# Patient Record
Sex: Male | Born: 1965 | Race: Black or African American | Hispanic: No | Marital: Single | Smoking: Former smoker
Health system: Southern US, Community
[De-identification: ages and names within clinical notes are randomized; demographics above are authoritative.]

## PROBLEM LIST (undated history)

## (undated) DIAGNOSIS — N200 Calculus of kidney: Secondary | ICD-10-CM

## (undated) DIAGNOSIS — I10 Essential (primary) hypertension: Secondary | ICD-10-CM

## (undated) HISTORY — PX: BACK SURGERY: SHX140

---

## 1998-06-03 ENCOUNTER — Encounter: Payer: Self-pay | Admitting: Emergency Medicine

## 1998-06-03 ENCOUNTER — Emergency Department (HOSPITAL_COMMUNITY): Admission: EM | Admit: 1998-06-03 | Discharge: 1998-06-03 | Payer: Self-pay | Admitting: Emergency Medicine

## 1999-09-26 ENCOUNTER — Emergency Department (HOSPITAL_COMMUNITY): Admission: EM | Admit: 1999-09-26 | Discharge: 1999-09-26 | Payer: Self-pay | Admitting: Emergency Medicine

## 2004-01-11 ENCOUNTER — Emergency Department (HOSPITAL_COMMUNITY): Admission: EM | Admit: 2004-01-11 | Discharge: 2004-01-11 | Payer: Self-pay | Admitting: Emergency Medicine

## 2005-04-25 ENCOUNTER — Emergency Department: Payer: Self-pay | Admitting: General Practice

## 2005-05-29 ENCOUNTER — Emergency Department: Payer: Self-pay | Admitting: Emergency Medicine

## 2005-06-06 ENCOUNTER — Ambulatory Visit: Payer: Self-pay | Admitting: Physician Assistant

## 2005-10-06 ENCOUNTER — Emergency Department: Payer: Self-pay | Admitting: Emergency Medicine

## 2008-08-12 ENCOUNTER — Emergency Department (HOSPITAL_COMMUNITY): Admission: EM | Admit: 2008-08-12 | Discharge: 2008-08-12 | Payer: Self-pay | Admitting: Emergency Medicine

## 2009-11-21 ENCOUNTER — Emergency Department (HOSPITAL_COMMUNITY): Admission: EM | Admit: 2009-11-21 | Discharge: 2009-11-21 | Payer: Self-pay | Admitting: Emergency Medicine

## 2010-01-31 ENCOUNTER — Emergency Department (HOSPITAL_COMMUNITY): Admission: EM | Admit: 2010-01-31 | Discharge: 2010-01-31 | Payer: Self-pay | Admitting: Emergency Medicine

## 2010-02-01 ENCOUNTER — Emergency Department (HOSPITAL_COMMUNITY)
Admission: EM | Admit: 2010-02-01 | Discharge: 2010-02-01 | Payer: Self-pay | Source: Home / Self Care | Admitting: Emergency Medicine

## 2010-02-18 ENCOUNTER — Emergency Department (HOSPITAL_COMMUNITY): Admission: EM | Admit: 2010-02-18 | Discharge: 2010-02-18 | Payer: Self-pay | Admitting: Emergency Medicine

## 2010-04-14 ENCOUNTER — Emergency Department (HOSPITAL_COMMUNITY)
Admission: EM | Admit: 2010-04-14 | Discharge: 2010-04-14 | Payer: Self-pay | Source: Home / Self Care | Admitting: Emergency Medicine

## 2010-04-15 LAB — URINALYSIS, ROUTINE W REFLEX MICROSCOPIC
Bilirubin Urine: NEGATIVE
Hgb urine dipstick: NEGATIVE
Ketones, ur: NEGATIVE mg/dL
Nitrite: NEGATIVE
Protein, ur: NEGATIVE mg/dL
Specific Gravity, Urine: 1.018 (ref 1.005–1.030)
Urine Glucose, Fasting: NEGATIVE mg/dL
Urobilinogen, UA: 1 mg/dL (ref 0.0–1.0)
pH: 6.5 (ref 5.0–8.0)

## 2010-04-15 LAB — POCT I-STAT, CHEM 8
BUN: 13 mg/dL (ref 6–23)
Calcium, Ion: 1.2 mmol/L (ref 1.12–1.32)
Chloride: 105 mEq/L (ref 96–112)
Creatinine, Ser: 1.2 mg/dL (ref 0.4–1.5)
Glucose, Bld: 95 mg/dL (ref 70–99)
HCT: 46 % (ref 39.0–52.0)
Hemoglobin: 15.6 g/dL (ref 13.0–17.0)
Potassium: 3.9 mEq/L (ref 3.5–5.1)
Sodium: 144 mEq/L (ref 135–145)
TCO2: 30 mmol/L (ref 0–100)

## 2010-06-11 LAB — URINALYSIS, ROUTINE W REFLEX MICROSCOPIC
Bilirubin Urine: NEGATIVE
Bilirubin Urine: NEGATIVE
Glucose, UA: NEGATIVE mg/dL
Glucose, UA: NEGATIVE mg/dL
Hgb urine dipstick: NEGATIVE
Hgb urine dipstick: NEGATIVE
Ketones, ur: NEGATIVE mg/dL
Ketones, ur: NEGATIVE mg/dL
Nitrite: NEGATIVE
Nitrite: NEGATIVE
Protein, ur: NEGATIVE mg/dL
Protein, ur: NEGATIVE mg/dL
Specific Gravity, Urine: 1.022 (ref 1.005–1.030)
Specific Gravity, Urine: 1.022 (ref 1.005–1.030)
Urobilinogen, UA: 0.2 mg/dL (ref 0.0–1.0)
Urobilinogen, UA: 0.2 mg/dL (ref 0.0–1.0)
pH: 6 (ref 5.0–8.0)
pH: 6.5 (ref 5.0–8.0)

## 2010-06-11 LAB — BASIC METABOLIC PANEL
BUN: 9 mg/dL (ref 6–23)
CO2: 26 mEq/L (ref 19–32)
Calcium: 9.6 mg/dL (ref 8.4–10.5)
Chloride: 106 mEq/L (ref 96–112)
Creatinine, Ser: 0.99 mg/dL (ref 0.4–1.5)
GFR calc Af Amer: >60 mL/min (ref >60)
GFR calc non Af Amer: >60 mL/min (ref >60)
Glucose, Bld: 108 mg/dL — ABNORMAL HIGH (ref 70–99)
Potassium: 3.7 mEq/L (ref 3.5–5.1)
Sodium: 141 mEq/L (ref 135–145)

## 2010-06-11 LAB — CBC
HCT: 42.2 % (ref 39.0–52.0)
HCT: 44.5 % (ref 39.0–52.0)
Hemoglobin: 14.4 g/dL (ref 13.0–17.0)
Hemoglobin: 14.8 g/dL (ref 13.0–17.0)
MCH: 29.5 pg (ref 26.0–34.0)
MCH: 30 pg (ref 26.0–34.0)
MCHC: 33.3 g/dL (ref 30.0–36.0)
MCHC: 34 g/dL (ref 30.0–36.0)
MCV: 88 fL (ref 78.0–100.0)
MCV: 88.5 fL (ref 78.0–100.0)
Platelets: 209 10*3/uL (ref 150–400)
Platelets: 221 10*3/uL (ref 150–400)
RBC: 4.79 MIL/uL (ref 4.22–5.81)
RBC: 5.03 MIL/uL (ref 4.22–5.81)
RDW: 14.5 % (ref 11.5–15.5)
RDW: 14.6 % (ref 11.5–15.5)
WBC: 7.1 10*3/uL (ref 4.0–10.5)
WBC: 8.6 10*3/uL (ref 4.0–10.5)

## 2010-06-11 LAB — HEPATIC FUNCTION PANEL
ALT: 14 U/L (ref 0–53)
AST: 21 U/L (ref 0–37)
Albumin: 3.7 g/dL (ref 3.5–5.2)
Alkaline Phosphatase: 42 U/L (ref 39–117)
Bilirubin, Direct: <0.1 mg/dL (ref 0.0–0.3)
Total Bilirubin: 0.4 mg/dL (ref 0.3–1.2)
Total Protein: 6.7 g/dL (ref 6.0–8.3)

## 2010-06-11 LAB — DIFFERENTIAL
Basophils Absolute: 0 10*3/uL (ref 0.0–0.1)
Basophils Absolute: 0 10*3/uL (ref 0.0–0.1)
Basophils Relative: 0 % (ref 0–1)
Basophils Relative: 0 % (ref 0–1)
Eosinophils Absolute: 0 10*3/uL (ref 0.0–0.7)
Eosinophils Absolute: 0.1 10*3/uL (ref 0.0–0.7)
Eosinophils Relative: 1 % (ref 0–5)
Eosinophils Relative: 1 % (ref 0–5)
Lymphocytes Relative: 10 % — ABNORMAL LOW (ref 12–46)
Lymphocytes Relative: 9 % — ABNORMAL LOW (ref 12–46)
Lymphs Abs: 0.6 10*3/uL — ABNORMAL LOW (ref 0.7–4.0)
Lymphs Abs: 0.9 10*3/uL (ref 0.7–4.0)
Monocytes Absolute: 0.5 10*3/uL (ref 0.1–1.0)
Monocytes Absolute: 0.5 10*3/uL (ref 0.1–1.0)
Monocytes Relative: 6 % (ref 3–12)
Monocytes Relative: 7 % (ref 3–12)
Neutro Abs: 5.9 10*3/uL (ref 1.7–7.7)
Neutro Abs: 7.1 10*3/uL (ref 1.7–7.7)
Neutrophils Relative %: 83 % — ABNORMAL HIGH (ref 43–77)
Neutrophils Relative %: 84 % — ABNORMAL HIGH (ref 43–77)

## 2010-06-11 LAB — POCT I-STAT, CHEM 8
BUN: 12 mg/dL (ref 6–23)
Calcium, Ion: 1.18 mmol/L (ref 1.12–1.32)
Chloride: 104 mEq/L (ref 96–112)
Creatinine, Ser: 1.2 mg/dL (ref 0.4–1.5)
Glucose, Bld: 97 mg/dL (ref 70–99)
HCT: 46 % (ref 39.0–52.0)
Hemoglobin: 15.6 g/dL (ref 13.0–17.0)
Potassium: 3.5 mEq/L (ref 3.5–5.1)
Sodium: 143 mEq/L (ref 135–145)
TCO2: 28 mmol/L (ref 0–100)

## 2010-06-11 LAB — LIPASE, BLOOD: Lipase: 36 U/L (ref 11–59)

## 2010-11-21 ENCOUNTER — Emergency Department (HOSPITAL_COMMUNITY)
Admission: EM | Admit: 2010-11-21 | Discharge: 2010-11-21 | Disposition: A | Discharge status: Home / Self Care | Payer: Self-pay | Attending: Emergency Medicine | Admitting: Emergency Medicine

## 2010-11-21 DIAGNOSIS — L03019 Cellulitis of unspecified finger: Secondary | ICD-10-CM | POA: Insufficient documentation

## 2010-11-21 DIAGNOSIS — M7989 Other specified soft tissue disorders: Secondary | ICD-10-CM | POA: Insufficient documentation

## 2010-11-21 DIAGNOSIS — I10 Essential (primary) hypertension: Secondary | ICD-10-CM | POA: Insufficient documentation

## 2010-11-21 DIAGNOSIS — K219 Gastro-esophageal reflux disease without esophagitis: Secondary | ICD-10-CM | POA: Insufficient documentation

## 2010-11-21 DIAGNOSIS — M79609 Pain in unspecified limb: Secondary | ICD-10-CM | POA: Insufficient documentation

## 2013-01-10 ENCOUNTER — Encounter (HOSPITAL_COMMUNITY): Payer: Self-pay | Admitting: Emergency Medicine

## 2013-01-10 ENCOUNTER — Emergency Department (HOSPITAL_COMMUNITY)
Admission: EM | Admit: 2013-01-10 | Discharge: 2013-01-10 | Disposition: A | Discharge status: Home / Self Care | Payer: Non-veteran care | Attending: Emergency Medicine | Admitting: Emergency Medicine

## 2013-01-10 DIAGNOSIS — M5416 Radiculopathy, lumbar region: Secondary | ICD-10-CM

## 2013-01-10 DIAGNOSIS — Z87891 Personal history of nicotine dependence: Secondary | ICD-10-CM | POA: Insufficient documentation

## 2013-01-10 DIAGNOSIS — I10 Essential (primary) hypertension: Secondary | ICD-10-CM | POA: Insufficient documentation

## 2013-01-10 DIAGNOSIS — G8929 Other chronic pain: Secondary | ICD-10-CM | POA: Insufficient documentation

## 2013-01-10 DIAGNOSIS — Z79899 Other long term (current) drug therapy: Secondary | ICD-10-CM | POA: Insufficient documentation

## 2013-01-10 DIAGNOSIS — Z87442 Personal history of urinary calculi: Secondary | ICD-10-CM | POA: Insufficient documentation

## 2013-01-10 DIAGNOSIS — IMO0002 Reserved for concepts with insufficient information to code with codable children: Secondary | ICD-10-CM | POA: Insufficient documentation

## 2013-01-10 HISTORY — DX: Essential (primary) hypertension: I10

## 2013-01-10 HISTORY — DX: Calculus of kidney: N20.0

## 2013-01-10 MED ORDER — DEXAMETHASONE SODIUM PHOSPHATE 10 MG/ML IJ SOLN
10.0000 mg | Freq: Once | INTRAMUSCULAR | Status: AC
  Administered 2013-01-10: 10 mg via INTRAMUSCULAR
  Filled 2013-01-10: qty 1

## 2013-01-10 MED ORDER — CYCLOBENZAPRINE HCL 10 MG PO TABS: 10.0000 mg | ORAL_TABLET | Freq: Three times a day (TID) | ORAL | Status: DC | PRN

## 2013-01-10 MED ORDER — PREDNISONE 50 MG PO TABS: 50.0000 mg | ORAL_TABLET | Freq: Every day | ORAL | Status: DC

## 2013-01-10 MED ORDER — HYDROCODONE-ACETAMINOPHEN 5-325 MG PO TABS: 1.0000 | ORAL_TABLET | Freq: Four times a day (QID) | ORAL | Status: DC | PRN

## 2013-01-10 NOTE — ED Provider Notes (Signed)
CSN: [id number]     Arrival date & time 01/10/13  1156 History   First MD Initiated Contact with Patient 01/10/13 1212     Chief Complaint  Patient presents with  . Back Pain   (Consider location/radiation/quality/duration/timing/severity/associated sxs/prior Treatment) HPI Patient presents to the emergency department with chronic right lower back pain.  Patient, states he said, back troubles over the last 20 years, with surgeries and 2004 2008.  Patient, states, that since his last surgeries had previous persistent pain.  Patient, states, that he usually gets cortisone injections helped with his discomfort.  Patient denies nausea, vomiting, abdominal pain, numbness, weakness, dizziness, fever, or syncope.  The patient, states, that he does not normally take everyday medicines for his back pain other than over-the-counter medications Past Medical History  Diagnosis Date  . Hypertension   . Kidney stone    Past Surgical History  Procedure Laterality Date  . Back surgery     No family history on file. History  Substance Use Topics  . Smoking status: Former Smoker -- 0.50 packs/day    Types: Cigarettes    Quit date: 03/31/2002  . Smokeless tobacco: Never Used  . Alcohol Use: Yes     Comment: Socially     Review of Systems All other systems negative except as documented in the HPI. All pertinent positives and negatives as reviewed in the HPI. Allergies  Review of patient's allergies indicates not on file.  Home Medications   Current Outpatient Rx  Name  Route  Sig  Dispense  Refill  . acetaminophen (TYLENOL) 500 MG tablet   Oral   Take 500 mg by mouth every 6 (six) hours as needed for pain.         Marland Kitchen ibuprofen (ADVIL,MOTRIN) 200 MG tablet   Oral   Take 200 mg by mouth 2 (two) times daily as needed for pain.         Marland Kitchen METOPROLOL SUCCINATE ER PO   Oral   Take by mouth.         Marland Kitchen omeprazole (PRILOSEC) 20 MG capsule   Oral   Take 20 mg by mouth daily.           BP 132/87  Pulse 57  Temp(Src) 98.3 F (36.8 C) (Oral)  Resp 18  SpO2 99% Physical Exam  Nursing note and vitals reviewed. Constitutional: He is oriented to person, place, and time. He appears well-developed and well-nourished. No distress.  HENT:  Head: Normocephalic.  Eyes: Pupils are equal, round, and reactive to light.  Neck: Normal range of motion. Neck supple.  Pulmonary/Chest: Effort normal.  Musculoskeletal:       Back:  Neurological: He is alert and oriented to person, place, and time. He exhibits normal muscle tone. Coordination normal.  Skin: Skin is warm and dry.    ED Course  Procedures (including critical care time) Labs Review Labs Reviewed - No data to display Imaging Review No results found.  EKG Interpretation   None      patient retreated for sciatic-type, back pain.  Patient is advised followup with his primary care Dr. told to return here as needed.  Patient does not have any neurological deficits and normal reflexes bilaterally.  Patient also has a normal gait  MDM      Carlyle Dolly, PA-C 01/10/13 1302

## 2013-01-10 NOTE — ED Notes (Addendum)
Pt reports having chronic back pain that began over 20 years ago. Pt reports having back surgery in 2004 and 2008. Pt reports the pain worsened two weeks ago and he has difficulty falling asleep. Pt denies any recent re-injury or trauma. Pt is A/O x4, ambulatory without complications, vital signs WDL.

## 2013-01-10 NOTE — ED Provider Notes (Signed)
  Medical screening examination/treatment/procedure(s) were performed by non-physician practitioner and as supervising physician I was immediately available for consultation/collaboration.    Keyosha Tiedt, MD 01/10/13 1545 

## 2013-05-17 ENCOUNTER — Ambulatory Visit: Payer: Non-veteran care

## 2013-06-16 ENCOUNTER — Emergency Department (HOSPITAL_COMMUNITY): Payer: Non-veteran care

## 2013-06-16 ENCOUNTER — Emergency Department (HOSPITAL_COMMUNITY)
Admission: EM | Admit: 2013-06-16 | Discharge: 2013-06-16 | Disposition: A | Discharge status: Home / Self Care | Payer: Non-veteran care | Attending: Emergency Medicine | Admitting: Emergency Medicine

## 2013-06-16 ENCOUNTER — Encounter (HOSPITAL_COMMUNITY): Payer: Self-pay | Admitting: Emergency Medicine

## 2013-06-16 DIAGNOSIS — Z79899 Other long term (current) drug therapy: Secondary | ICD-10-CM | POA: Insufficient documentation

## 2013-06-16 DIAGNOSIS — Z9889 Other specified postprocedural states: Secondary | ICD-10-CM | POA: Insufficient documentation

## 2013-06-16 DIAGNOSIS — S59909A Unspecified injury of unspecified elbow, initial encounter: Secondary | ICD-10-CM | POA: Insufficient documentation

## 2013-06-16 DIAGNOSIS — I10 Essential (primary) hypertension: Secondary | ICD-10-CM | POA: Insufficient documentation

## 2013-06-16 DIAGNOSIS — Z791 Long term (current) use of non-steroidal anti-inflammatories (NSAID): Secondary | ICD-10-CM | POA: Insufficient documentation

## 2013-06-16 DIAGNOSIS — S99929A Unspecified injury of unspecified foot, initial encounter: Secondary | ICD-10-CM

## 2013-06-16 DIAGNOSIS — S4980XA Other specified injuries of shoulder and upper arm, unspecified arm, initial encounter: Secondary | ICD-10-CM | POA: Insufficient documentation

## 2013-06-16 DIAGNOSIS — M549 Dorsalgia, unspecified: Secondary | ICD-10-CM

## 2013-06-16 DIAGNOSIS — S6990XA Unspecified injury of unspecified wrist, hand and finger(s), initial encounter: Secondary | ICD-10-CM | POA: Insufficient documentation

## 2013-06-16 DIAGNOSIS — S8990XA Unspecified injury of unspecified lower leg, initial encounter: Secondary | ICD-10-CM | POA: Insufficient documentation

## 2013-06-16 DIAGNOSIS — Z87891 Personal history of nicotine dependence: Secondary | ICD-10-CM | POA: Insufficient documentation

## 2013-06-16 DIAGNOSIS — S46909A Unspecified injury of unspecified muscle, fascia and tendon at shoulder and upper arm level, unspecified arm, initial encounter: Secondary | ICD-10-CM | POA: Insufficient documentation

## 2013-06-16 DIAGNOSIS — IMO0002 Reserved for concepts with insufficient information to code with codable children: Secondary | ICD-10-CM | POA: Insufficient documentation

## 2013-06-16 DIAGNOSIS — S199XXA Unspecified injury of neck, initial encounter: Secondary | ICD-10-CM

## 2013-06-16 DIAGNOSIS — Z87442 Personal history of urinary calculi: Secondary | ICD-10-CM | POA: Insufficient documentation

## 2013-06-16 DIAGNOSIS — S0993XA Unspecified injury of face, initial encounter: Secondary | ICD-10-CM | POA: Insufficient documentation

## 2013-06-16 DIAGNOSIS — S99919A Unspecified injury of unspecified ankle, initial encounter: Secondary | ICD-10-CM

## 2013-06-16 DIAGNOSIS — S59919A Unspecified injury of unspecified forearm, initial encounter: Secondary | ICD-10-CM

## 2013-06-16 DIAGNOSIS — M7918 Myalgia, other site: Secondary | ICD-10-CM

## 2013-06-16 MED ORDER — DEXAMETHASONE SODIUM PHOSPHATE 10 MG/ML IJ SOLN
10.0000 mg | Freq: Once | INTRAMUSCULAR | Status: AC
  Administered 2013-06-16: 10 mg via INTRAVENOUS
  Filled 2013-06-16: qty 1

## 2013-06-16 MED ORDER — HYDROMORPHONE HCL PF 1 MG/ML IJ SOLN
1.0000 mg | Freq: Once | INTRAMUSCULAR | Status: AC
Start: 2013-06-16 — End: 2013-06-16
  Administered 2013-06-16: 1 mg via INTRAVENOUS
  Filled 2013-06-16: qty 1

## 2013-06-16 MED ORDER — SODIUM CHLORIDE 0.9 % IV SOLN
INTRAVENOUS | Status: DC
  Administered 2013-06-16: 10:00:00 via INTRAVENOUS

## 2013-06-16 MED ORDER — HYDROMORPHONE HCL PF 1 MG/ML IJ SOLN
0.5000 mg | Freq: Once | INTRAMUSCULAR | Status: AC
Start: 2013-06-16 — End: 2013-06-16
  Administered 2013-06-16: 0.5 mg via INTRAVENOUS
  Filled 2013-06-16: qty 1

## 2013-06-16 MED ORDER — OXYCODONE-ACETAMINOPHEN 5-325 MG PO TABS: 1.0000 | ORAL_TABLET | Freq: Four times a day (QID) | ORAL | Status: DC | PRN

## 2013-06-16 MED ORDER — HYDROMORPHONE HCL PF 1 MG/ML IJ SOLN
1.0000 mg | Freq: Once | INTRAMUSCULAR | Status: AC
  Administered 2013-06-16: 1 mg via INTRAVENOUS
  Filled 2013-06-16: qty 1

## 2013-06-16 MED ORDER — IBUPROFEN 800 MG PO TABS: 800.0000 mg | ORAL_TABLET | Freq: Three times a day (TID) | ORAL | Status: DC

## 2013-06-16 MED ORDER — CYCLOBENZAPRINE HCL 10 MG PO TABS: 10.0000 mg | ORAL_TABLET | Freq: Two times a day (BID) | ORAL | Status: DC | PRN

## 2013-06-16 NOTE — ED Notes (Signed)
Bed: WA23 Expected date:  Expected time:  Means of arrival:  Comments: EMS-back pain 

## 2013-06-16 NOTE — ED Provider Notes (Signed)
Medical screening examination/treatment/procedure(s) were performed by non-physician practitioner and as supervising physician I was immediately available for consultation/collaboration.   EKG Interpretation None        Rigby Leonhardt T Margaree Sandhu, MD 06/16/13 1820 
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
## 2013-06-16 NOTE — Discharge Instructions (Signed)
Back Pain, Adult Low back pain is very common. About 1 in 5 people have back pain.The cause of low back pain is rarely dangerous. The pain often gets better over time.About half of people with a sudden onset of back pain feel better in just 2 weeks. About 8 in 10 people feel better by 6 weeks.  CAUSES Some common causes of back pain include:  Strain of the muscles or ligaments supporting the spine.  Wear and tear (degeneration) of the spinal discs.  Arthritis.  Direct injury to the back. DIAGNOSIS Most of the time, the direct cause of low back pain is not known.However, back pain can be treated effectively even when the exact cause of the pain is unknown.Answering your caregiver's questions about your overall health and symptoms is one of the most accurate ways to make sure the cause of your pain is not dangerous. If your caregiver needs more information, he or she may order lab work or imaging tests (X-rays or MRIs).However, even if imaging tests show changes in your back, this usually does not require surgery. HOME CARE INSTRUCTIONS For many people, back pain returns.Since low back pain is rarely dangerous, it is often a condition that people can learn to [facility] their own.   Remain active. It is stressful on the back to sit or stand in one place. Do not sit, drive, or stand in one place for more than 30 minutes at a time. Take short walks on level surfaces as soon as pain allows.Try to increase the length of time you walk each day.  Do not stay in bed.Resting more than 1 or 2 days can delay your recovery.  Do not avoid exercise or work.Your body is made to move.It is not dangerous to be active, even though your back may hurt.Your back will likely heal faster if you return to being active before your pain is gone.  Pay attention to your body when you bend and lift. Many people have less discomfortwhen lifting if they bend their knees, keep the load close to their bodies,and  avoid twisting. Often, the most comfortable positions are those that put less stress on your recovering back.  Find a comfortable position to sleep. Use a firm mattress and lie on your side with your knees slightly bent. If you lie on your back, put a pillow under your knees.  Only take over-the-counter or prescription medicines as directed by your caregiver. Over-the-counter medicines to reduce pain and inflammation are often the most helpful.Your caregiver may prescribe muscle relaxant drugs.These medicines help dull your pain so you can more quickly return to your normal activities and healthy exercise.  Put ice on the injured area.  Put ice in a plastic bag.  Place a towel between your skin and the bag.  Leave the ice on for 15-20 minutes, 03-04 times a day for the first 2 to 3 days. After that, ice and heat may be alternated to reduce pain and spasms.  Ask your caregiver about trying back exercises and gentle massage. This may be of some benefit.  Avoid feeling anxious or stressed.Stress increases muscle tension and can worsen back pain.It is important to recognize when you are anxious or stressed and learn ways to manage it.Exercise is a great option. SEEK MEDICAL CARE IF:  You have pain that is not relieved with rest or medicine.  You have pain that does not improve in 1 week.  You have new symptoms.  You are generally not feeling well. SEEK  IMMEDIATE MEDICAL CARE IF:   You have pain that radiates from your back into your legs.  You develop new bowel or bladder control problems.  You have unusual weakness or numbness in your arms or legs.  You develop nausea or vomiting.  You develop abdominal pain.  You feel faint. Document Released: 03/17/2005 Document Revised: 09/16/2011 Document Reviewed: 08/05/2010 [facility] Patient Information 2014 [place], [place]. Assault, General Assault includes any behavior, whether intentional or reckless, which results in bodily injury  to another person and/or damage to property. Included in this would be any behavior, intentional or reckless, that by its nature would be understood (interpreted) by a reasonable person as intent to harm another person or to damage his/her property. Threats may be oral or written. They may be communicated through regular mail, computer, fax, or phone. These threats may be direct or implied. FORMS OF ASSAULT INCLUDE:  Physically assaulting a person. This includes physical threats to inflict physical harm as well as:  Slapping.  Hitting.  Poking.  Kicking.  Punching.  Pushing.  Arson.  Sabotage.  Equipment vandalism.  Damaging or destroying property.  Throwing or hitting objects.  Displaying a weapon or an object that appears to be a weapon in a threatening manner.  Carrying a firearm of any kind.  Using a weapon to harm someone.  Using greater physical size/strength to intimidate another.  Making intimidating or threatening gestures.  Bullying.  Hazing.  Intimidating, threatening, hostile, or abusive language directed toward another person.  It communicates the intention to engage in violence against that person. And it leads a reasonable person to expect that violent behavior may occur.  Stalking another person. IF IT HAPPENS AGAIN:  Immediately call for emergency help (911 in [place]  If someone poses clear and immediate danger to you, seek legal authorities to have a protective or restraining order put in place.  Less threatening assaults can at least be reported to authorities. STEPS TO TAKE IF A SEXUAL ASSAULT HAS HAPPENED  Go to an area of safety. This may include a shelter or staying with a friend. Stay away from the area where you have been attacked. A large percentage of sexual assaults are caused by a friend, relative or associate.  If medications were given by your caregiver, take them as directed for the full length of time prescribed.  Only take  over-the-counter or prescription medicines for pain, discomfort, or fever as directed by your caregiver.  If you have come in contact with a sexual disease, find out if you are to be tested again. If your caregiver is concerned about the HIV/AIDS virus, he/she may require you to have continued testing for several months.  For the protection of your privacy, test results can not be given over the phone. Make sure you receive the results of your test. If your test results are not back during your visit, make an appointment with your caregiver to find out the results. Do not assume everything is normal if you have not heard from your caregiver or the medical facility. It is important for you to follow up on all of your test results.  File appropriate papers with authorities. This is important in all assaults, even if it has occurred in a family or by a friend. SEEK MEDICAL CARE IF:  You have new problems because of your injuries.  You have problems that may be because of the medicine you are taking, such as:  Rash.  Itching.  Swelling.  Trouble breathing.  You develop belly (abdominal) pain, feel sick to your stomach (nausea) or are vomiting.  You begin to run a temperature.  You need supportive care or referral to a rape crisis center. These are centers with trained personnel who can help you get through this ordeal. SEEK IMMEDIATE MEDICAL CARE IF:  You are afraid of being threatened, beaten, or abused. In [place] call 911.  You receive new injuries related to abuse.  You develop severe pain in any area injured in the assault or have any change in your condition that concerns you.  You faint or lose consciousness.  You develop chest pain or shortness of breath. Document Released: 03/17/2005 Document Revised: 06/09/2011 Document Reviewed: 11/03/2007 [facility] Patient Information 2014 [place], [place].
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
## 2014-06-15 ENCOUNTER — Encounter (HOSPITAL_COMMUNITY): Payer: Self-pay | Admitting: Emergency Medicine

## 2014-06-15 ENCOUNTER — Emergency Department (HOSPITAL_COMMUNITY)
Admission: EM | Admit: 2014-06-15 | Discharge: 2014-06-15 | Disposition: A | Discharge status: Home / Self Care | Payer: Non-veteran care | Attending: Emergency Medicine | Admitting: Emergency Medicine

## 2014-06-15 ENCOUNTER — Emergency Department (HOSPITAL_COMMUNITY): Payer: Non-veteran care

## 2014-06-15 DIAGNOSIS — S50312A Abrasion of left elbow, initial encounter: Secondary | ICD-10-CM | POA: Insufficient documentation

## 2014-06-15 DIAGNOSIS — W1789XA Other fall from one level to another, initial encounter: Secondary | ICD-10-CM | POA: Diagnosis not present

## 2014-06-15 DIAGNOSIS — S6992XA Unspecified injury of left wrist, hand and finger(s), initial encounter: Secondary | ICD-10-CM | POA: Diagnosis present

## 2014-06-15 DIAGNOSIS — Z87891 Personal history of nicotine dependence: Secondary | ICD-10-CM | POA: Diagnosis not present

## 2014-06-15 DIAGNOSIS — Y9389 Activity, other specified: Secondary | ICD-10-CM | POA: Diagnosis not present

## 2014-06-15 DIAGNOSIS — S2232XA Fracture of one rib, left side, initial encounter for closed fracture: Secondary | ICD-10-CM | POA: Insufficient documentation

## 2014-06-15 DIAGNOSIS — I10 Essential (primary) hypertension: Secondary | ICD-10-CM | POA: Diagnosis not present

## 2014-06-15 DIAGNOSIS — Z791 Long term (current) use of non-steroidal anti-inflammatories (NSAID): Secondary | ICD-10-CM | POA: Diagnosis not present

## 2014-06-15 DIAGNOSIS — S40212A Abrasion of left shoulder, initial encounter: Secondary | ICD-10-CM | POA: Insufficient documentation

## 2014-06-15 DIAGNOSIS — Y998 Other external cause status: Secondary | ICD-10-CM | POA: Insufficient documentation

## 2014-06-15 DIAGNOSIS — S52122A Displaced fracture of head of left radius, initial encounter for closed fracture: Secondary | ICD-10-CM

## 2014-06-15 DIAGNOSIS — W19XXXA Unspecified fall, initial encounter: Secondary | ICD-10-CM

## 2014-06-15 DIAGNOSIS — Z79899 Other long term (current) drug therapy: Secondary | ICD-10-CM | POA: Diagnosis not present

## 2014-06-15 DIAGNOSIS — S52135A Nondisplaced fracture of neck of left radius, initial encounter for closed fracture: Secondary | ICD-10-CM | POA: Diagnosis not present

## 2014-06-15 DIAGNOSIS — Y9289 Other specified places as the place of occurrence of the external cause: Secondary | ICD-10-CM | POA: Insufficient documentation

## 2014-06-15 DIAGNOSIS — Z87442 Personal history of urinary calculi: Secondary | ICD-10-CM | POA: Insufficient documentation

## 2014-06-15 DIAGNOSIS — S0081XA Abrasion of other part of head, initial encounter: Secondary | ICD-10-CM | POA: Diagnosis not present

## 2014-06-15 MED ORDER — NAPROXEN 500 MG PO TABS: 500.0000 mg | ORAL_TABLET | Freq: Two times a day (BID) | ORAL | Status: DC

## 2014-06-15 MED ORDER — HYDROCODONE-ACETAMINOPHEN 5-325 MG PO TABS: 1.0000 | ORAL_TABLET | ORAL | Status: DC | PRN

## 2014-06-15 MED ORDER — HYDROCODONE-ACETAMINOPHEN 5-325 MG PO TABS
1.0000 | ORAL_TABLET | Freq: Once | ORAL | Status: AC
  Administered 2014-06-15: 1 via ORAL
  Filled 2014-06-15: qty 1

## 2014-06-15 NOTE — Discharge Instructions (Signed)
Radial Head Fracture A radial head fracture is a break of the smaller bone (radius) in the forearm. The head of this bone is the part near the elbow. These fractures commonly happen during a fall, when you land on an outstretched arm. These fractures are more common in middle aged adults and are common with a dislocation of the elbow. SYMPTOMS   Swelling of the elbow joint and pain on the outside of the elbow.  Pain and difficulty in bending or straightening the elbow.  Pain and difficulty in turning the palm of the hand up or down with the elbow bent. DIAGNOSIS  Your caregiver may make this diagnosis by a physical exam. X-rays can confirm the type and amount of fracture. Sometimes a fracture that is not displaced cannot be seen on the original X-ray. TREATMENT  Radial head fractures are classified according to the amount of movement (displacement) of parts from the normal position.  Type 1 Fractures  Type 1 fractures are generally small fractures in which bone pieces remain together (nondisplaced fracture).  The fracture may not be seen on initial X-rays. Usually if X-rays are repeated two to three weeks later, the fracture will show up. A splint or sling is used for a few days. Gentle early motion is used to prevent the elbow from becoming stiff. It should not be done vigorously or forced as this could displace the bone pieces. Type 2 Fractures  With type 2 fractures, bone pieces are slightly displaced and larger pieces of bone are broken off.  If only a little displacement of the bone piece is present, splinting for 4 to 5 days usually works well. This is again followed with gentle active range of motion. Small fragments may be surgically removed.  Large pieces of bone that can be put back into place will sometimes be fixed with pins or screws to hold them until the bone is healed. If this cannot be done, the fragments are removed. For older, less active people, sometimes the entire radial  head is removed if the wrist is not injured. The elbow and arm will still work fine. Soft tissue, tendon, and ligament injuries are corrected at the same time. Type 3 Fractures  Type 3 fractures have multiple broken pieces of bone that cannot be fixed. Surgery is usually needed to remove the broken bits of bone and what is left of the radial head. Soft-tissue damage is repaired. Gentle early motion is used to prevent the elbow from becoming stiff. Sometimes an artificial radial head can be used to prevent deformity if the elbow is unstable. Rest, ice, elevation, immobilization, medications, and pain control are used in the early care. HOME CARE INSTRUCTIONS   Keep the injured part elevated while sitting or lying down. Keep the injury above the level of your heart (the center of the chest). This will decrease swelling and pain.  Apply ice to the injury for 15-20 minutes, 03-04 times per day while awake, for 2 days. Put the ice in a plastic bag and place a towel between the bag of ice and your cast or splint.  Move your fingers to avoid stiffness and minimize swelling.  If you have a plaster or fiberglass cast:  Do not try to scratch the skin under the cast using sharp or pointed objects.  Check the skin around the cast every day. You may put lotion on any red or sore areas.  Keep your cast dry and clean.  If you have a plaster splint:  Wear the splint as directed.  You may loosen the elastic around the splint if your fingers become numb, tingle, or turn cold or blue.  Do not put pressure on any part of your cast or splint. It may break. Rest your cast only on a pillow for the first 24 hours until it is fully hardened.  Your cast or splint can be protected during bathing with a plastic bag. Do not lower the cast or splint into the water.  Only take over-the-counter or prescription medicines for pain, discomfort, or fever as directed by your caregiver.  Follow all instructions for  follow-up with your caregiver. This includes any orthopedic referrals, physical therapy, and rehabilitation. Any delay in obtaining necessary care could result in a delay or failure of the bones to heal or permanent elbow stiffness.  Do not overdo exercises. This could further damage your injury. SEEK IMMEDIATE MEDICAL CARE IF:   Your cast or splint gets damaged or breaks.  You have more severe pain or swelling than you did before getting the cast.  You have severe pain when stretching your fingers.  There is a bad smell, new stains, and/or pus-like (purulent) drainage coming from under the cast.  Your fingers or hand turn pale or blue, become cold, or you lose feeling. Document Released: 01/06/2006 Document Revised: 08/01/2013 Document Reviewed: 02/13/2009 [facility] Patient Information 2015 [place]. This information is not intended to replace advice given to you by your health care provider. Make sure you discuss any questions you have with your health care provider. Rib Fracture A rib fracture is a break or crack in one of the bones of the ribs. The ribs are a group of long, curved bones that wrap around your chest and attach to your spine. They protect your lungs and other organs in the chest cavity. A broken or cracked rib is often painful, but most do not cause other problems. Most rib fractures heal on their own over time. However, rib fractures can be more serious if multiple ribs are broken or if broken ribs move out of place and push against other structures. CAUSES   A direct blow to the chest. For example, this could happen during contact sports, a car accident, or a fall against a hard object.  Repetitive movements with high force, such as pitching a baseball or having severe coughing spells. SYMPTOMS   Pain when you breathe in or cough.  Pain when someone presses on the injured area. DIAGNOSIS  Your caregiver will perform a physical exam. Various imaging tests may be  ordered to confirm the diagnosis and to look for related injuries. These tests may include a chest X-ray, computed tomography (CT), magnetic resonance imaging (MRI), or a bone scan. TREATMENT  Rib fractures usually heal on their own in 1-3 months. The longer healing period is often associated with a continued cough or other aggravating activities. During the healing period, pain control is very important. Medication is usually given to control pain. Hospitalization or surgery may be needed for more severe injuries, such as those in which multiple ribs are broken or the ribs have moved out of place.  HOME CARE INSTRUCTIONS   Avoid strenuous activity and any activities or movements that cause pain. Be careful during activities and avoid bumping the injured rib.  Gradually increase activity as directed by your caregiver.  Only take over-the-counter or prescription medications as directed by your caregiver. Do not take other medications without asking your caregiver first.  Apply ice  to the injured area for the first 1-2 days after you have been treated or as directed by your caregiver. Applying ice helps to reduce inflammation and pain.  Put ice in a plastic bag.  Place a towel between your skin and the bag.   Leave the ice on for 15-20 minutes at a time, every 2 hours while you are awake.  Perform deep breathing as directed by your caregiver. This will help prevent pneumonia, which is a common complication of a broken rib. Your caregiver may instruct you to:  Take deep breaths several times a day.  Try to cough several times a day, holding a pillow against the injured area.  Use a device called an incentive spirometer to practice deep breathing several times a day.  Drink enough fluids to keep your urine clear or pale yellow. This will help you avoid constipation.   Do not wear a rib belt or binder. These restrict breathing, which can lead to pneumonia.  SEEK IMMEDIATE MEDICAL CARE IF:    You have a fever.   You have difficulty breathing or shortness of breath.   You develop a continual cough, or you cough up thick or bloody sputum.  You feel sick to your stomach (nausea), throw up (vomit), or have abdominal pain.   You have worsening pain not controlled with medications.  MAKE SURE YOU:  Understand these instructions.  Will watch your condition.  Will get help right away if you are not doing well or get worse. Document Released: 03/17/2005 Document Revised: 11/17/2012 Document Reviewed: 05/19/2012 [facility] Patient Information 2015 [place], [place]. This information is not intended to replace advice given to you by your health care provider. Make sure you discuss any questions you have with your health care provider. Sling Use After Injury or Surgery You have been put in a sling today because of an injury or following surgery. If you have a tendon or bone injury it may take up to 6 weeks to heal. Use the sling as directed until your caregiver says it is no longer needed. The sling protects and keeps you from using the injured part. Hanging your arm in a sling will give rest and support to the injured part. This also helps with comfort and healing. Slings are used for injuries made worse or more painful by movement. Examples include:  Broken arms.  Broken collarbones.  Shoulder injuries.  Following surgery. The sling should fit comfortably, with your elbow at one end of the sling and your hand at the other end. Your elbow is bent 90 degrees lying across your waist and rests in the sling with your thumb pointing up. Make sure that the hand of the injured arm does not droop down. That could stretch some nerves in the wrist. Your hand should be slightly higher than your elbow. You may also pad the sling behind your neck with some cloth or foam rubber.  A swathe may also be used if it is necessary to keep you from lifting your injured arm. A swathe is a wrap or ace  bandage that goes around your chest over your injured arm.  To take the weight off your neck, some slings have a strap that goes around your neck and down your back. One strap is connected to the closed elbow side of the sling with the other end of the strap attached to the wrist side. With a sling like this, your injured shoulder, arm, wrist, or hand is in the sling, the  weight is more on your shoulder and back. This is different from the illustration where the sling is supported only by the neck.  In an emergency, a sling can be as simple as a belt or towel tied around your neck to hold your forearm.  HOME CARE INSTRUCTIONS   Do not use your shoulder until instructed to by your caregiver.  If you have been prescribed physical therapy, keep appointments as directed.  For the first couple days following your injury and during times when you are sore, you may use ice on the injured area for 15-20 minutes 03-04 times per day while awake. Put the ice in a plastic bag and place a towel between the bag of ice and your skin. This will help keep the swelling down.  If there is numbness in the fifth finger and ring fingers you may need to pad the elbow to relieve pressure on the ulnar nerve (the crazy bone).  Keep your arm on your chest when lying down.  If a plaster splint was applied, wear the splint until you are seen for a follow-up examination. Rest it on nothing harder than a pillow the first 24 hours. Do not get it wet. You may take it off to take a shower or bath unless instructed otherwise by your caregiver.  You may have been given an elastic bandage to use with the plaster splint or alone. The splint is too tight if you have numbness, tingling, or if your hand becomes cold and blue. Adjust or reapply the bandage to make it comfortable.  Only take over-the-counter or prescription medicines for pain, discomfort, or fever as directed by your caregiver.  If range of motion exercises are permitted  by your caregiver, do not go over the limits suggested. If you have increased pain from doing gentle exercises, stop the exercises until you see your caregiver again.  The length of time needed for healing depends on what your injury or surgery was. SEEK IMMEDIATE MEDICAL CARE IF:   You have an increase in bruising, swelling or pain in the area of your injury or surgery.  You notice a blue color of or coldness in your fingers.  Pain relief is not obtained with medications or any of your problems are getting worse. Document Released: 10/30/2003 Document Revised: 03/03/2012 Document Reviewed: 01/30/2007 [facility] Patient Information 2015 [place] [employer], [place]. This information is not intended to replace advice given to you by your health care provider. Make sure you discuss any questions you have with your health care provider.

## 2014-06-15 NOTE — ED Provider Notes (Signed)
CSN: [id number]     Arrival date & time 06/15/14  1940 History  This chart was scribed for non-physician practitioner, Everlene Farrier, PA-C working with Mancel Bale, MD, by Abel Presto, ED Scribe. This patient was seen in room TR10C/TR10C and the patient's care was started at 8:42 PM.      Chief Complaint  Patient presents with  . Arm Injury    Patient is a 49 y.o. male presenting with arm injury. The history is provided by the patient. No language interpreter was used.  Arm Injury Associated symptoms: no back pain, no fever and no neck pain    HPI Comments: Richard Barrera is a 49 y.o. male who presents to the Emergency Department complaining of 8-9/10 left arm pain with onset around 5 PM. Pt was moving items in a rental truck and states he fell off, landing onto left side on concrete. Pt states he hit his head, noting headache. Pt notes some numbness in left fingers and left rib pain. Pt has taken Advil with mild relief. Pt denies LOC, neck pain, back pain, weakness, bowel or urinary incontinence, urinary frequency, nausea, vomiting, abdominal pain, dizziness, and lightheadedness.   Past Medical History  Diagnosis Date  . Hypertension   . Kidney stone    Past Surgical History  Procedure Laterality Date  . Back surgery     History reviewed. No pertinent family history. History  Substance Use Topics  . Smoking status: Former Smoker -- 0.50 packs/day    Types: Cigarettes    Quit date: 03/31/2002  . Smokeless tobacco: Never Used  . Alcohol Use: Yes     Comment: Socially     Review of Systems  Constitutional: Negative for fever.  HENT: Negative for ear pain.   Eyes: Negative for pain and visual disturbance.  Respiratory: Negative for shortness of breath and wheezing.   Gastrointestinal: Negative for nausea, vomiting and abdominal pain.  Genitourinary: Negative for dysuria and difficulty urinating.  Musculoskeletal: Positive for myalgias and arthralgias. Negative for back  pain, gait problem, neck pain and neck stiffness.  Skin: Negative for rash.  Neurological: Positive for numbness. Negative for dizziness, weakness, light-headedness and headaches.      Allergies  Review of patient's allergies indicates no known allergies.  Home Medications   Prior to Admission medications   Medication Sig Start Date End Date Taking? Authorizing Provider  acetaminophen (TYLENOL) 500 MG tablet Take 500 mg by mouth every 6 (six) hours as needed for pain.    Historical Provider, MD  cyclobenzaprine (FLEXERIL) 10 MG tablet Take 1 tablet (10 mg total) by mouth 2 (two) times daily as needed for muscle spasms. 06/16/13   Tiffany Neva Seat, PA-C  ETODOLAC PO Take 1 tablet by mouth 2 (two) times daily.    Historical Provider, MD  gabapentin (NEURONTIN) 400 MG capsule Take 400 mg by mouth 3 (three) times daily.    Historical Provider, MD  HYDROcodone-acetaminophen (NORCO/VICODIN) 5-325 MG per tablet Take 1-2 tablets by mouth every 4 (four) hours as needed. 06/15/14   Everlene Farrier, PA-C  ibuprofen (ADVIL,MOTRIN) 800 MG tablet Take 1 tablet (800 mg total) by mouth 3 (three) times daily. 06/16/13   Tiffany Neva Seat, PA-C  naproxen (NAPROSYN) 500 MG tablet Take 1 tablet (500 mg total) by mouth 2 (two) times daily with a meal. 06/15/14   Everlene Farrier, PA-C  oxyCODONE-acetaminophen (PERCOCET/ROXICET) 5-325 MG per tablet Take 1-2 tablets by mouth every 6 (six) hours as needed for severe pain. 06/16/13   Tiffany  Neva Seat, PA-C  pantoprazole (PROTONIX) 40 MG tablet Take 40 mg by mouth daily.    Historical Provider, MD   BP 156/92 mmHg  Pulse 72  Temp(Src) 98.6 F (37 C) (Oral)  Resp 18  Ht  (1.88 m)  Wt 188 lb (85.276 kg)  BMI 24.13 kg/m2  SpO2 97% Physical Exam  Constitutional: He is oriented to person, place, and time. He appears well-developed and well-nourished. No distress.  HENT:  Head: Normocephalic.  Right Ear: External ear normal.  Left Ear: External ear normal.  Nose: Nose  normal.  Mouth/Throat: Oropharynx is clear and moist. No oropharyngeal exudate.  Small abrasion to left posterior lateral head.  Bilateral tympanic membranes are pearly-gray without erythema or loss of landmarks. No temporal edema. No crepitus or edema noted to his head. No bleeding or lacerations.   Eyes: Conjunctivae and EOM are normal. Pupils are equal, round, and reactive to light. Right eye exhibits no discharge. Left eye exhibits no discharge.  Neck: Normal range of motion. Neck supple. No JVD present. No tracheal deviation present.  Neck is non-tender to palpation. No bony point tenderness. Full range of motion.  Cardiovascular: Normal rate, regular rhythm, normal heart sounds and intact distal pulses.  Exam reveals no friction rub.   No murmur heard. Bilateral radial pulses intact.  Pulmonary/Chest: Effort normal and breath sounds normal. No respiratory distress. He has no wheezes. He has no rales.  Abdominal: Soft. Bowel sounds are normal. He exhibits no distension. There is no tenderness.  Musculoskeletal: Normal range of motion.       Cervical back: He exhibits no bony tenderness and no edema.       Thoracic back: He exhibits no bony tenderness and no edema.       Lumbar back: He exhibits no bony tenderness and no edema.  Tenderness to his left lateral elbow. Full range of motion of his left shoulder. Back: no crepitus, no step-offs, no edema.  Mild tenderness over his left wrist, full ROM of his left wrist.  Patient is spontaneously moving all extremities in a coordinated fashion exhibiting good strength. Patient is able to ambulate without difficulty or assistance.  Lymphadenopathy:    He has no cervical adenopathy.  Neurological: He is alert and oriented to person, place, and time. He has normal reflexes. He displays normal reflexes. No cranial nerve deficit. Coordination normal.  Radial, medial, and ulnar nerves intact LUE. Sensation is intact in his bilateral upper  extremities.  Skin: Skin is warm and dry. No rash noted. He is not diaphoretic. No erythema. No pallor.  Small abrasions to left shoulder and elbow  Psychiatric: He has a normal mood and affect. His behavior is normal.  Nursing note and vitals reviewed.   ED Course  Procedures (including critical care time) DIAGNOSTIC STUDIES: Oxygen Saturation is 96% on room air, normal by my interpretation.    COORDINATION OF CARE: 8:49 PM Discussed treatment plan with patient at beside, the patient agrees with the plan and has no further questions at this time.   Labs Review Labs Reviewed - No data to display  Imaging Review Dg Ribs Unilateral W/chest Left  06/15/2014   CLINICAL DATA:  Status post fall 4-5 feet from back of rental truck, landing on left side. Left lateral rib pain. Initial encounter.  EXAM: LEFT RIBS AND CHEST - 3+ VIEW  COMPARISON:  None.  FINDINGS: There is a minimally displaced fracture of the left lateral seventh rib.  The lungs are well-aerated  and clear. There is no evidence of focal opacification, pleural effusion or pneumothorax.  The cardiomediastinal silhouette is borderline normal in size. No acute osseous abnormalities are seen.  IMPRESSION: Minimally displaced fracture of the left lateral seventh rib. Lungs remain grossly clear.   Electronically Signed   By: Roanna RaiderJeffery  Chang M.D.   On: 06/15/2014 21:47   Dg Elbow Complete Left  06/15/2014   CLINICAL DATA:  Left arm pain while lifting a heavy object.  EXAM: LEFT ELBOW - COMPLETE 3+ VIEW  COMPARISON:  None.  FINDINGS: There is a nondisplaced fracture of the left radial neck. There is no evidence of arthropathy or other focal bone abnormality. Soft tissues are unremarkable.  IMPRESSION: Nondisplaced fracture of the left radial neck.   Electronically Signed   By: Elige KoHetal  Patel   On: 06/15/2014 20:22   Dg Wrist Complete Left  06/15/2014   CLINICAL DATA:  Status post fall 4-5 feet from back of rental truck. Left wrist pain. Initial  encounter.  EXAM: LEFT WRIST - COMPLETE 3+ VIEW  COMPARISON:  None.  FINDINGS: There is no evidence of fracture or dislocation. The carpal rows are intact, and demonstrate normal alignment. The joint spaces are preserved.  No significant soft tissue abnormalities are seen.  IMPRESSION: No evidence of fracture or dislocation.   Electronically Signed   By: Roanna RaiderJeffery  Chang M.D.   On: 06/15/2014 21:50   Dg Shoulder Left  06/15/2014   CLINICAL DATA:  Left shoulder pain following fall on left side, initial encounter  EXAM: LEFT SHOULDER - 2+ VIEW  COMPARISON:  None.  FINDINGS: There is no evidence of fracture or dislocation. There is no evidence of arthropathy or other focal bone abnormality. Soft tissues are unremarkable.  IMPRESSION: No acute abnormality noted.   Electronically Signed   By: Alcide CleverMark  Lukens M.D.   On: 06/15/2014 20:32   Dg Humerus Left  06/15/2014   CLINICAL DATA:  Left arm pain. Injured pulling a heavy object off a truck.  EXAM: LEFT HUMERUS - 2+ VIEW  COMPARISON:  None.  FINDINGS: There is no evidence of fracture or other focal bone lesions. Soft tissues are unremarkable.  IMPRESSION: Negative.   Electronically Signed   By: Elige KoHetal  Patel   On: 06/15/2014 20:25     EKG Interpretation None      Filed Vitals:   06/15/14 1945 06/15/14 1950 06/15/14 2303  BP: 160/98  156/92  Pulse: 71  72  Temp: 98.7 F (37.1 C)  98.6 F (37 C)  TempSrc: Oral  Oral  Resp: 20  18  Height:  6\' 2"  (1.88 m)   Weight:  188 lb (85.276 kg)   SpO2: 96%  97%     MDM   Meds given in ED:  Medications  HYDROcodone-acetaminophen (NORCO/VICODIN) 5-325 MG per tablet 1 tablet (1 tablet Oral Given 06/15/14 2118)  HYDROcodone-acetaminophen (NORCO/VICODIN) 5-325 MG per tablet 1 tablet (1 tablet Oral Given 06/15/14 2258)    Discharge Medication List as of 06/15/2014 10:55 PM    START taking these medications   Details  HYDROcodone-acetaminophen (NORCO/VICODIN) 5-325 MG per tablet Take 1-2 tablets by mouth every  4 (four) hours as needed., Starting 06/15/2014, Until Discontinued, Print    naproxen (NAPROSYN) 500 MG tablet Take 1 tablet (500 mg total) by mouth 2 (two) times daily with a meal., Starting 06/15/2014, Until Discontinued, Print        Final diagnoses:  Left radial head fracture, closed, initial encounter  Left rib fracture,  closed, initial encounter  Fall, initial encounter   This is a 49 year old male who presents to the emergency department after falling off the back of the [employer] truck that was approximately 4 feet off the ground. Swelling on his left side and is complaining of left elbow, left rib pain, left wrist pain and left shoulder pain. The patient reports hitting his head but denies loss of consciousness, changes to vision, tingling or weakness. Patient has tenderness over his left lateral elbow, mild tenderness over his left wrist and left ribs. The patient has no focal neuro deficits. The patient is afebrile and nontoxic appearing. X-ray of his left shoulder and left humerus are negative. X-ray of his left elbow shows a nondisplaced fracture of the left radial neck. Left wrist x-ray is negative. Left rib x-ray with chest indicates a mildly displaced fracture of the left lateral seventh rib. Lungs are clear. After discussing this patient with my attending Dr. Effie Shy we'll discharge this patient with a sling and have him follow-up with orthopedic surgery. I provided him with prescriptions for naproxen and Norco for breakthrough pain. I advised him to use a balloon to attempt to keep his lungs clear and prevent pneumonia. I advised the patient to follow-up with their primary care provider this week and to follow up with orthopedic surgery. I advised the patient to return to the emergency department with new or worsening symptoms or new concerns. The patient verbalized understanding and agreement with plan.   I personally performed the services described in this documentation, which was  scribed in my presence. The recorded information has been reviewed and is accurate.     Everlene Farrier, PA-C 06/16/14 1610  Mancel Bale, MD 06/16/14 [phone]

## 2014-06-15 NOTE — ED Notes (Signed)
Patient here with complaint of fall from rental truck. Explains that he was working inside truck attempting to move treadmill with rope. Rope came lose and patient fell from back of truck landing of left side of upper body. Estimates back of truck is about 4 feet high. Denies LOC. Able to move left arm but it is painful.

## 2014-06-25 ENCOUNTER — Emergency Department (HOSPITAL_COMMUNITY)
Admission: EM | Admit: 2014-06-25 | Discharge: 2014-06-25 | Disposition: A | Discharge status: Home / Self Care | Payer: Non-veteran care | Attending: Emergency Medicine | Admitting: Emergency Medicine

## 2014-06-25 ENCOUNTER — Encounter (HOSPITAL_COMMUNITY): Payer: Self-pay | Admitting: *Deleted

## 2014-06-25 DIAGNOSIS — Z87891 Personal history of nicotine dependence: Secondary | ICD-10-CM | POA: Insufficient documentation

## 2014-06-25 DIAGNOSIS — Z791 Long term (current) use of non-steroidal anti-inflammatories (NSAID): Secondary | ICD-10-CM | POA: Insufficient documentation

## 2014-06-25 DIAGNOSIS — S4992XA Unspecified injury of left shoulder and upper arm, initial encounter: Secondary | ICD-10-CM | POA: Diagnosis present

## 2014-06-25 DIAGNOSIS — Y998 Other external cause status: Secondary | ICD-10-CM | POA: Insufficient documentation

## 2014-06-25 DIAGNOSIS — S161XXA Strain of muscle, fascia and tendon at neck level, initial encounter: Secondary | ICD-10-CM | POA: Insufficient documentation

## 2014-06-25 DIAGNOSIS — W1839XA Other fall on same level, initial encounter: Secondary | ICD-10-CM | POA: Insufficient documentation

## 2014-06-25 DIAGNOSIS — I10 Essential (primary) hypertension: Secondary | ICD-10-CM | POA: Insufficient documentation

## 2014-06-25 DIAGNOSIS — Z87442 Personal history of urinary calculi: Secondary | ICD-10-CM | POA: Diagnosis not present

## 2014-06-25 DIAGNOSIS — Z79899 Other long term (current) drug therapy: Secondary | ICD-10-CM | POA: Diagnosis not present

## 2014-06-25 DIAGNOSIS — Y9289 Other specified places as the place of occurrence of the external cause: Secondary | ICD-10-CM | POA: Diagnosis not present

## 2014-06-25 DIAGNOSIS — Y9389 Activity, other specified: Secondary | ICD-10-CM | POA: Insufficient documentation

## 2014-06-25 DIAGNOSIS — T148XXA Other injury of unspecified body region, initial encounter: Secondary | ICD-10-CM

## 2014-06-25 MED ORDER — CYCLOBENZAPRINE HCL 10 MG PO TABS: 10.0000 mg | ORAL_TABLET | Freq: Two times a day (BID) | ORAL | Status: DC | PRN

## 2014-06-25 MED ORDER — IBUPROFEN 800 MG PO TABS: 800.0000 mg | ORAL_TABLET | Freq: Three times a day (TID) | ORAL | Status: DC

## 2014-06-25 NOTE — Discharge Instructions (Signed)

## 2014-06-25 NOTE — ED Notes (Signed)
Declined W/C at D/C and was escorted to lobby by RN. 

## 2014-06-25 NOTE — ED Provider Notes (Signed)
CSN: [id number]     Arrival date & time 06/25/14  [id number] History   First MD Initiated Contact with Patient 06/25/14 [phone]     Chief Complaint  Patient presents with  . Shoulder Pain     (Consider location/radiation/quality/duration/timing/severity/associated sxs/prior Treatment) Patient is a 49 y.o. male presenting with shoulder pain. The history is provided by the patient. No language interpreter was used.  Shoulder Pain Location:  Shoulder Time since incident:  10 days Injury: yes   Mechanism of injury: fall   Fall:    Fall occurred:  Standing   Impact surface:  Unable to specify   Point of impact:  Unable to specify   Entrapped after fall: no   Shoulder location:  L shoulder Pain details:    Quality:  Aching   Radiates to: left neck.   Severity:  Moderate   Onset quality:  Gradual   Timing:  Constant   Progression:  Unchanged Chronicity:  New   Past Medical History  Diagnosis Date  . Hypertension   . Kidney stone    Past Surgical History  Procedure Laterality Date  . Back surgery     History reviewed. No pertinent family history. History  Substance Use Topics  . Smoking status: Former Smoker -- 0.50 packs/day    Types: Cigarettes    Quit date: 03/31/2002  . Smokeless tobacco: Never Used  . Alcohol Use: Yes     Comment: Socially     Review of Systems  All other systems reviewed and are negative.     Allergies  Review of patient's allergies indicates no known allergies.  Home Medications   Prior to Admission medications   Medication Sig Start Date End Date Taking? Authorizing Provider  acetaminophen (TYLENOL) 500 MG tablet Take 500 mg by mouth every 6 (six) hours as needed for pain.    Historical Provider, MD  cyclobenzaprine (FLEXERIL) 10 MG tablet Take 1 tablet (10 mg total) by mouth 2 (two) times daily as needed for muscle spasms. 06/16/13   Tiffany Neva SeatGreene, PA-C  ETODOLAC PO Take 1 tablet by mouth 2 (two) times daily.    Historical Provider, MD   gabapentin (NEURONTIN) 400 MG capsule Take 400 mg by mouth 3 (three) times daily.    Historical Provider, MD  HYDROcodone-acetaminophen (NORCO/VICODIN) 5-325 MG per tablet Take 1-2 tablets by mouth every 4 (four) hours as needed. 06/15/14   Everlene FarrierWilliam Dansie, PA-C  ibuprofen (ADVIL,MOTRIN) 800 MG tablet Take 1 tablet (800 mg total) by mouth 3 (three) times daily. 06/16/13   Tiffany Neva SeatGreene, PA-C  naproxen (NAPROSYN) 500 MG tablet Take 1 tablet (500 mg total) by mouth 2 (two) times daily with a meal. 06/15/14   Everlene FarrierWilliam Dansie, PA-C  oxyCODONE-acetaminophen (PERCOCET/ROXICET) 5-325 MG per tablet Take 1-2 tablets by mouth every 6 (six) hours as needed for severe pain. 06/16/13   Tiffany Neva SeatGreene, PA-C  pantoprazole (PROTONIX) 40 MG tablet Take 40 mg by mouth daily.    Historical Provider, MD   BP 158/102 mmHg  Pulse 63  Temp(Src) 97.7 F (36.5 C) (Oral)  Resp 20  SpO2 98% Physical Exam  Constitutional: He is oriented to person, place, and time. He appears well-developed and well-nourished.  Cardiovascular: Normal rate and regular rhythm.   Pulmonary/Chest: Effort normal and breath sounds normal.  Musculoskeletal:  Tender left cervical paraspinal area. Equal grip strength bilaterally. Full rom  Neurological: He is alert and oriented to person, place, and time.  Skin: Skin is warm and dry.  Nursing  note and vitals reviewed.   ED Course  Procedures (including critical care time) Labs Review Labs Reviewed - No data to display  Imaging Review No results found.   EKG Interpretation None      MDM   Final diagnoses:  Muscle strain   Pt had x-ray on 3-17. Don't think further imaging is needed. Will given muscle relaxers and ibuprofen    Teressa Lower, NP 06/25/14 0900  Linwood Dibbles, MD 06/27/14 [phone]

## 2014-06-25 NOTE — ED Notes (Signed)
Pt reports on going pain to Lt shoulder Pt was seen on 06-15-14

## 2015-12-31 ENCOUNTER — Encounter (HOSPITAL_COMMUNITY): Payer: Self-pay | Admitting: Emergency Medicine

## 2015-12-31 ENCOUNTER — Emergency Department (HOSPITAL_COMMUNITY)
Admission: EM | Admit: 2015-12-31 | Discharge: 2015-12-31 | Disposition: A | Discharge status: Home / Self Care | Payer: Non-veteran care | Attending: Emergency Medicine | Admitting: Emergency Medicine

## 2015-12-31 DIAGNOSIS — K921 Melena: Secondary | ICD-10-CM | POA: Diagnosis present

## 2015-12-31 DIAGNOSIS — Z87891 Personal history of nicotine dependence: Secondary | ICD-10-CM | POA: Insufficient documentation

## 2015-12-31 DIAGNOSIS — K625 Hemorrhage of anus and rectum: Secondary | ICD-10-CM | POA: Diagnosis not present

## 2015-12-31 DIAGNOSIS — Z79899 Other long term (current) drug therapy: Secondary | ICD-10-CM | POA: Diagnosis not present

## 2015-12-31 DIAGNOSIS — I10 Essential (primary) hypertension: Secondary | ICD-10-CM | POA: Insufficient documentation

## 2015-12-31 LAB — CBC
HCT: 50.8 % (ref 39.0–52.0)
HEMOGLOBIN: 17 g/dL (ref 13.0–17.0)
MCH: 30.3 pg (ref 26.0–34.0)
MCHC: 33.5 g/dL (ref 30.0–36.0)
MCV: 90.6 fL (ref 78.0–100.0)
Platelets: 195 10*3/uL (ref 150–400)
RBC: 5.61 MIL/uL (ref 4.22–5.81)
RDW: 15.9 % — ABNORMAL HIGH (ref 11.5–15.5)
WBC: 3.9 10*3/uL — ABNORMAL LOW (ref 4.0–10.5)

## 2015-12-31 LAB — COMPREHENSIVE METABOLIC PANEL
ALBUMIN: 4.7 g/dL (ref 3.5–5.0)
ALK PHOS: 52 U/L (ref 38–126)
ALT: 16 U/L — ABNORMAL LOW (ref 17–63)
ANION GAP: 6 (ref 5–15)
AST: 23 U/L (ref 15–41)
BUN: 10 mg/dL (ref 6–20)
CALCIUM: 9.7 mg/dL (ref 8.9–10.3)
CHLORIDE: 102 mmol/L (ref 101–111)
CO2: 31 mmol/L (ref 22–32)
Creatinine, Ser: 1.04 mg/dL (ref 0.61–1.24)
GFR calc non Af Amer: >60 mL/min (ref >60)
GLUCOSE: 112 mg/dL — AB (ref 65–99)
Potassium: 4.3 mmol/L (ref 3.5–5.1)
SODIUM: 139 mmol/L (ref 135–145)
Total Bilirubin: 0.9 mg/dL (ref 0.3–1.2)
Total Protein: 7.7 g/dL (ref 6.5–8.1)

## 2015-12-31 LAB — PROTIME-INR
INR: 0.92
Prothrombin Time: 12.4 seconds (ref 11.4–15.2)

## 2015-12-31 LAB — TYPE AND SCREEN
ABO/RH(D): O POS
Antibody Screen: NEGATIVE

## 2015-12-31 LAB — ABO/RH: ABO/RH(D): O POS

## 2015-12-31 MED ORDER — DOCUSATE SODIUM 100 MG PO CAPS: 100.0000 mg | ORAL_CAPSULE | Freq: Two times a day (BID) | ORAL | 0 refills | Status: DC

## 2015-12-31 NOTE — ED Provider Notes (Signed)
[facility]-EMERGENCY DEPT Provider Note   CSN: [id number] Arrival date & time: 12/31/15  0902     History   Chief Complaint Chief Complaint  Patient presents with  . Blood In Stools    HPI Richard Barrera is a 50 y.o. male.  HPI Patient presents to the emergency department complaints of bright red blood in the stool today.  He had an episode similar to this 2 months ago where he had blood on the outside of brown stool.  He had another episode 2 weeks ago followed by an episode today.  He's had no rectal bleeding in between.  He has no history of hemorrhoids.  [facility]  He is 50 years old.  He's never had a colonoscopy or sigmoidoscopy.  No family history of colon cancer.  He denies abdominal pain.  No nausea or vomiting.  No weakness or lightheadedness.  No chest pain shortness of breath.  He is not on blood thinners.  He reports that he has 1-2 bowel movements every day and that is typical for him.  He does not describe constipation   Past Medical History:  Diagnosis Date  . Hypertension   . Kidney stone     There are no active problems to display for this patient.   Past Surgical History:  Procedure Laterality Date  . BACK SURGERY         Home Medications    Prior to Admission medications   Medication Sig Start Date End Date Taking? Authorizing Provider  amLODipine (NORVASC) 10 MG tablet Take 10 mg by mouth daily.   Yes Historical Provider, MD  diphenhydrAMINE (BENADRYL) 25 mg capsule Take 25 mg by mouth at bedtime.   Yes Historical Provider, MD  mirtazapine (REMERON) 7.5 MG tablet Take 7.5 mg by mouth at bedtime as needed (for pain/sleep).   Yes Historical Provider, MD  naproxen (NAPROSYN) 500 MG tablet Take 1 tablet (500 mg total) by mouth 2 (two) times daily with a meal. Patient taking differently: Take 500 mg by mouth 2 (two) times daily as needed for mild pain.  06/15/14  Yes Everlene FarrierWilliam Dansie, PA-C  omeprazole (PRILOSEC) 40 MG capsule Take 40 mg by  mouth daily.   Yes Historical Provider, MD  docusate sodium (COLACE) 100 MG capsule Take 1 capsule (100 mg total) by mouth every 12 (twelve) hours. 12/31/15   Azalia BilisKevin Kalandra Masters, MD    Family History No family history on file.  Social History Social History  Substance Use Topics  . Smoking status: Former Smoker    Packs/day: 0.50    Types: Cigarettes    Quit date: 03/31/2002  . Smokeless tobacco: Never Used  . Alcohol use Yes     Comment: Socially      Allergies   Review of patient's allergies indicates no known allergies.   Review of Systems Review of Systems  All other systems reviewed and are negative.    Physical Exam Updated Vital Signs BP 157/96   Pulse (!) 54   Temp 97.5 F (36.4 C) (Oral)   Resp 17   Ht 6\' 2"  (1.88 m)   Wt 190 lb (86.2 kg)   SpO2 99%   BMI 24.39 kg/m   Physical Exam  Constitutional: He is oriented to person, place, and time. He appears well-developed and well-nourished.  HENT:  Head: Normocephalic and atraumatic.  Eyes: EOM are normal.  Neck: Normal range of motion.  Cardiovascular: Normal rate, regular rhythm, normal heart sounds and intact  distal pulses.   Pulmonary/Chest: Effort normal and breath sounds normal. No respiratory distress.  Abdominal: Soft. He exhibits no distension. There is no tenderness.  Genitourinary:  Genitourinary Comments: Brown stool on rectal examination.  No gross blood.  No hemorrhoids noted.  No masses palpated  Musculoskeletal: Normal range of motion.  Neurological: He is alert and oriented to person, place, and time.  Skin: Skin is warm and dry.  Psychiatric: He has a normal mood and affect. Judgment normal.  Nursing note and vitals reviewed.    ED Treatments / Results  Labs (all labs ordered are listed, but only abnormal results are displayed) Labs Reviewed  CBC - Abnormal; Notable for the following:       Result Value   WBC 3.9 (*)    RDW 15.9 (*)    All other components within normal limits    COMPREHENSIVE METABOLIC PANEL - Abnormal; Notable for the following:    Glucose, Bld 112 (*)    ALT 16 (*)    All other components within normal limits  PROTIME-INR  POC OCCULT BLOOD, ED  TYPE AND SCREEN  ABO/RH    EKG  EKG Interpretation None       Radiology No results found.  Procedures Procedures (including critical care time)  Medications Ordered in ED Medications - No data to display   Initial Impression / Assessment and Plan / ED Course  I have reviewed the triage vital signs and the nursing notes.  Pertinent labs & imaging results that were available during my care of the patient were reviewed by me and considered in my medical decision making (see chart for details).  Clinical Course    Hemoglobin stable.  Vital signs stable.  Patient will need outpatient GI follow-up.  He may benefit from sigmoidoscopy or colonoscopy.  Overall well-appearing.  Hemoglobin 17.  He understands to return to the ER for new or worsening symptoms.  He understands the importance of outpatient GI follow-up and possible direct visualization  Final Clinical Impressions(s) / ED Diagnoses   Final diagnoses:  Rectal bleeding    New Prescriptions New Prescriptions   DOCUSATE SODIUM (COLACE) 100 MG CAPSULE    Take 1 capsule (100 mg total) by mouth every 12 (twelve) hours.     Azalia Bilis, MD 12/31/15 1054

## 2015-12-31 NOTE — ED Triage Notes (Signed)
Patient states that he has bright red blood in stools. States that has been going on for few weeks now and when first started [facility].

## 2016-06-26 ENCOUNTER — Emergency Department (HOSPITAL_COMMUNITY): Payer: Non-veteran care

## 2016-06-26 ENCOUNTER — Emergency Department (HOSPITAL_COMMUNITY)
Admission: EM | Admit: 2016-06-26 | Discharge: 2016-06-26 | Disposition: A | Discharge status: Home / Self Care | Payer: Non-veteran care | Attending: Emergency Medicine | Admitting: Emergency Medicine

## 2016-06-26 ENCOUNTER — Encounter (HOSPITAL_COMMUNITY): Payer: Self-pay | Admitting: Emergency Medicine

## 2016-06-26 DIAGNOSIS — I10 Essential (primary) hypertension: Secondary | ICD-10-CM | POA: Insufficient documentation

## 2016-06-26 DIAGNOSIS — R030 Elevated blood-pressure reading, without diagnosis of hypertension: Secondary | ICD-10-CM

## 2016-06-26 DIAGNOSIS — R51 Headache: Secondary | ICD-10-CM | POA: Diagnosis present

## 2016-06-26 DIAGNOSIS — R112 Nausea with vomiting, unspecified: Secondary | ICD-10-CM | POA: Diagnosis not present

## 2016-06-26 DIAGNOSIS — Z87891 Personal history of nicotine dependence: Secondary | ICD-10-CM | POA: Insufficient documentation

## 2016-06-26 DIAGNOSIS — R519 Headache, unspecified: Secondary | ICD-10-CM

## 2016-06-26 LAB — COMPREHENSIVE METABOLIC PANEL
ALT: 25 U/L (ref 17–63)
AST: 27 U/L (ref 15–41)
Albumin: 4.3 g/dL (ref 3.5–5.0)
Alkaline Phosphatase: 52 U/L (ref 38–126)
Anion gap: 8 (ref 5–15)
BUN: 14 mg/dL (ref 6–20)
CHLORIDE: 102 mmol/L (ref 101–111)
CO2: 28 mmol/L (ref 22–32)
Calcium: 10.1 mg/dL (ref 8.9–10.3)
Creatinine, Ser: 1.2 mg/dL (ref 0.61–1.24)
GFR calc Af Amer: >60 mL/min (ref >60)
Glucose, Bld: 98 mg/dL (ref 65–99)
POTASSIUM: 4.6 mmol/L (ref 3.5–5.1)
SODIUM: 138 mmol/L (ref 135–145)
Total Bilirubin: 0.6 mg/dL (ref 0.3–1.2)
Total Protein: 7.1 g/dL (ref 6.5–8.1)

## 2016-06-26 LAB — CBC
HEMATOCRIT: 49.2 % (ref 39.0–52.0)
Hemoglobin: 16.8 g/dL (ref 13.0–17.0)
MCH: 30 pg (ref 26.0–34.0)
MCHC: 34.1 g/dL (ref 30.0–36.0)
MCV: 87.9 fL (ref 78.0–100.0)
PLATELETS: 214 10*3/uL (ref 150–400)
RBC: 5.6 MIL/uL (ref 4.22–5.81)
RDW: 15.3 % (ref 11.5–15.5)
WBC: 6.9 10*3/uL (ref 4.0–10.5)

## 2016-06-26 MED ORDER — ONDANSETRON 4 MG PO TBDP: 4.0000 mg | ORAL_TABLET | Freq: Three times a day (TID) | ORAL | 0 refills | Status: DC | PRN

## 2016-06-26 MED ORDER — ACETAMINOPHEN 325 MG PO TABS
650.0000 mg | ORAL_TABLET | Freq: Once | ORAL | Status: AC
  Administered 2016-06-26: 650 mg via ORAL
  Filled 2016-06-26: qty 2

## 2016-06-26 NOTE — ED Provider Notes (Signed)
[facility]-EMERGENCY DEPT Provider Note   CSN: [id number] Arrival date & time: 06/26/16  1907     History   Chief Complaint Chief Complaint  Patient presents with  . Emesis  . Migraine    HPI Richard Barrera is a 51 y.o. male with PMHx of HTN, kidney stone Presents today with complaints of worsening headache today at 5pm. He describes he headache as sudden onset, sharp, constant, unchanged, 5/10.  He reports associated diaphoresis, emesis, dizziness, lightheadedness. He reports not trying anything for his pain. He states he normally does not take anything for his headaches.  He denies chest pain, shortness of breath, abdominal pain, numbness, tingling, visual changes or disturbances, changes in gait, seizures, LOC, neck pain, neck stiffness. He states sometimes he has sensitivity to light, however he denies having it today. He states he often gets headaches and emesis in the morning. He states this has been happening for the last 6-8 months. He states he feels his headache is similar to the episodes he has had in the last 6 months. He states these symptoms happened after he ate salad, baked potato, and steak for lunch. He states that his PCP is aware and was referred to neurology. He states his appointment with neurology is 5/3. He states he was off of his BP medications for about 2-3 months and just started back on all his medications 2 days ago, including today.    The history is provided by the patient. No language interpreter was used.    Past Medical History:  Diagnosis Date  . Hypertension   . Kidney stone     There are no active problems to display for this patient.   Past Surgical History:  Procedure Laterality Date  . BACK SURGERY         Home Medications    Prior to Admission medications   Medication Sig Start Date End Date Taking? Authorizing Provider  amitriptyline (ELAVIL) 25 MG tablet Take 12.5 mg by mouth 2 (two) times daily.    Historical Provider, MD    amLODipine (NORVASC) 10 MG tablet Take 10 mg by mouth daily.    Historical Provider, MD  diphenhydrAMINE (BENADRYL) 25 mg capsule Take 25 mg by mouth at bedtime.    Historical Provider, MD  docusate sodium (COLACE) 100 MG capsule Take 1 capsule (100 mg total) by mouth every 12 (twelve) hours. 12/31/15   Azalia BilisKevin Campos, MD  mirtazapine (REMERON) 7.5 MG tablet Take 7.5 mg by mouth at bedtime as needed (for pain/sleep).    Historical Provider, MD  naproxen (NAPROSYN) 500 MG tablet Take 1 tablet (500 mg total) by mouth 2 (two) times daily with a meal. Patient taking differently: Take 500 mg by mouth 2 (two) times daily as needed for mild pain.  06/15/14   Everlene FarrierWilliam Dansie, PA-C  omeprazole (PRILOSEC) 40 MG capsule Take 40 mg by mouth daily.    Historical Provider, MD  ondansetron (ZOFRAN ODT) 4 MG disintegrating tablet Take 1 tablet (4 mg total) by mouth every 8 (eight) hours as needed for nausea or vomiting. 06/26/16   Alvina ChouFrancisco Manuel Berenise [place], [place]    Family History History reviewed. No pertinent family history.  Social History Social History  Substance Use Topics  . Smoking status: Former Smoker    Packs/day: 0.50    Types: Cigarettes    Quit date: 03/31/2002  . Smokeless tobacco: Never Used  . Alcohol use Yes     Comment: Socially      Allergies  Patient has no known allergies.   Review of Systems Review of Systems  Constitutional: Positive for chills. Negative for fever.  Respiratory: Negative for shortness of breath.   Cardiovascular: Negative for chest pain.  Gastrointestinal: Positive for nausea and vomiting. Negative for abdominal pain and diarrhea.  Genitourinary: Negative for difficulty urinating and dysuria.  Neurological: Positive for dizziness, light-headedness and headaches. Negative for seizures, speech difficulty and numbness.  All other systems reviewed and are negative.    Physical Exam Updated Vital Signs BP (!) 147/92   Pulse (!) 50   Temp 97.8 F (36.6 C)  (Oral)   Resp 15   Ht 6\' 2"  (1.88 m)   Wt 83.9 kg   SpO2 95%   BMI 23.75 kg/m   Physical Exam  Constitutional: He is oriented to person, place, and time. He appears well-developed and well-nourished.  Well appearing  HENT:  Head: Normocephalic and atraumatic.  Nose: Nose normal.  Mouth/Throat: Oropharynx is clear and moist.  No tenderness to temporal region bilaterally.  Eyes: Conjunctivae and EOM are normal. Pupils are equal, round, and reactive to light.  Neck: Normal range of motion. Neck supple. No JVD present.  No nuchal rigidity noted. Normal range of motion.  Cardiovascular: Normal rate, normal heart sounds and intact distal pulses.   No murmur heard. Pulmonary/Chest: Effort normal and breath sounds normal. No stridor. No respiratory distress. He has no wheezes. He has no rales.  Normal work of breathing. No respiratory distress noted.   Abdominal: Soft. There is no tenderness. There is no rebound and no guarding.  Musculoskeletal: Normal range of motion.  Neurological: He is alert and oriented to person, place, and time.  Cranial Nerves:  III,IV, VI: ptosis not present, extra-ocular movements intact bilaterally, direct and consensual pupillary light reflexes intact bilaterally V: facial sensation, jaw opening, and bite strength equal bilaterally VII: eyebrow raise, eyelid close, smile, frown, pucker equal bilaterally VIII: hearing grossly normal bilaterally  IX,X: palate elevation and swallowing intact XI: bilateral shoulder shrug and lateral head rotation equal and strong XII: midline tongue extension  Negative pronator drift, negative Romberg, negative RAM's, negative heel-to-shin, negative finger to nose.    Sensory intact.  Muscle strength 5/5 Patient able to ambulate without difficulty.   Skin: Skin is warm. Capillary refill takes less than 2 seconds.  Psychiatric: He has a normal mood and affect. His behavior is normal.  Nursing note and vitals  reviewed.    ED Treatments / Results  Labs (all labs ordered are listed, but only abnormal results are displayed) Labs Reviewed  CBC  COMPREHENSIVE METABOLIC PANEL  TSH    EKG  EKG Interpretation  Date/Time:  Thursday June 26 2016 19:43:20 EDT Ventricular Rate:  52 PR Interval:  156 QRS Duration: 90 QT Interval:  466 QTC Calculation: 433 R Axis:   58 Text Interpretation:  Sinus bradycardia Biatrial enlargement Left ventricular hypertrophy with repolarization abnormality Cannot rule out Inferior infarct , age undetermined Abnormal ECG T wave inversion in I, II, v4-6- more pronounce now in lateral leads Confirmed by RAY MD, Duwayne Heck [phone]) on 06/26/2016 7:46:53 PM       Radiology Ct Head Wo Contrast  Result Date: 06/26/2016 CLINICAL DATA:  Acute onset of migraine headaches, nausea and high blood pressure. Initial encounter. EXAM: CT HEAD WITHOUT CONTRAST TECHNIQUE: Contiguous axial images were obtained from the base of the skull through the vertex without intravenous contrast. COMPARISON:  None. FINDINGS: Brain: No evidence of acute infarction, hemorrhage, hydrocephalus, extra-axial  collection or mass lesion/mass effect. The posterior fossa, including the cerebellum, brainstem and fourth ventricle, is within normal limits. The third and lateral ventricles, and basal ganglia are unremarkable in appearance. The cerebral hemispheres are symmetric in appearance, with normal gray-white differentiation. No mass effect or midline shift is seen. Vascular: No hyperdense vessel or unexpected calcification. Skull: There is no evidence of fracture; visualized osseous structures are unremarkable in appearance. Sinuses/Orbits: Mild bilateral proptosis is noted. The orbits are otherwise unremarkable in appearance. The paranasal sinuses and mastoid air cells are well-aerated. Other: No significant soft tissue abnormalities are seen. IMPRESSION: 1. No acute intracranial pathology seen on CT. 2. Mild  bilateral proptosis noted. Electronically Signed   By: Roanna Raider M.D.   On: 06/26/2016 21:25    Procedures Procedures (including critical care time)  Medications Ordered in ED Medications  acetaminophen (TYLENOL) tablet 650 mg (650 mg Oral Given 06/26/16 2237)     Initial Impression / Assessment and Plan / ED Course  I have reviewed the triage vital signs and the nursing notes.  Pertinent labs & imaging results that were available during my care of the patient were reviewed by me and considered in my medical decision making (see chart for details).     Pt HA treated and improved while in ED.  CT negative for acute intracranial pathology. Presentation is like pts previous HA and non concerning for [facility], ICH, Meningitis, or temporal arteritis.  Pt is afebrile with no focal neuro deficits, nuchal rigidity, or change in vision. Pt is to follow up with PCP tomorrow to discuss today's visit. Pt in NAD, hemodynamically stable, afebrile. Pt verbalizes understanding and is agreeable with plan to dc.    Final Clinical Impressions(s) / ED Diagnoses   Final diagnoses:  Nonintractable headache, unspecified chronicity pattern, unspecified headache type  Non-intractable vomiting with nausea, unspecified vomiting type  Elevated blood pressure reading    New Prescriptions New Prescriptions   ONDANSETRON (ZOFRAN ODT) 4 MG DISINTEGRATING TABLET    Take 1 tablet (4 mg total) by mouth every 8 (eight) hours as needed for nausea or vomiting.     [address] [place], [place] 06/26/16 2336    Margarita Grizzle, MD 06/27/16 [phone]

## 2016-06-26 NOTE — ED Notes (Signed)
Pt back in room. No distress observed. 

## 2016-06-26 NOTE — Discharge Instructions (Signed)
Please take ibuprofen or naproxen as needed for pain relief. Drink plenty of fluids throughout the day. He is low-salt diet. Go to your scheduled appointment tomorrow with your primary care provider for follow-up on today's visit. Take Zofran as needed for nausea and vomiting.  Get help right away if: Your headache becomes severe. You have repeated vomiting. You have a stiff neck. You have a loss of vision. You have problems with speech. You have pain in the eye or ear. You have muscular weakness or loss of muscle control. You lose your balance or have trouble walking. You feel faint or pass out. You have confusion. Get help right away if: You develop a severe headache or confusion. You have unusual weakness or numbness. You feel faint. You have severe pain in your chest or abdomen. You vomit repeatedly. You have trouble breathing.

## 2016-06-26 NOTE — ED Triage Notes (Signed)
Pt states that similar episode happened 2 weeks ago where he became diaphoretic and began vomiting. States he has been having headaches for 6 months.

## 2016-06-26 NOTE — ED Notes (Signed)
Patient transported to CT 

## 2016-06-26 NOTE — ED Triage Notes (Signed)
Pt arrived to Ed via EMS. Episode of emesis followed by severe headache. Pt has a hx of migraine but reports this being the worst headache ever. Hx of HTN. Pty given 4mg  of zofran by EMS.

## 2017-11-15 IMAGING — CT CT HEAD W/O CM
4 series · 17 of 47 positions shown, 19 images · non-contrast
Comparison: None.

CLINICAL DATA: Acute onset of migraine headaches, nausea and high
blood pressure. Initial encounter.

EXAM:
CT HEAD WITHOUT CONTRAST
TECHNIQUE: Contiguous axial images were obtained from the base of the skull
through the vertex without intravenous contrast.

[Series 3: head without · axial · non-contrast · 0.41mm/px · z∈[-105,+30]mm · 7 of 37 slices shown, 9 images]
[im 5/37  brain]
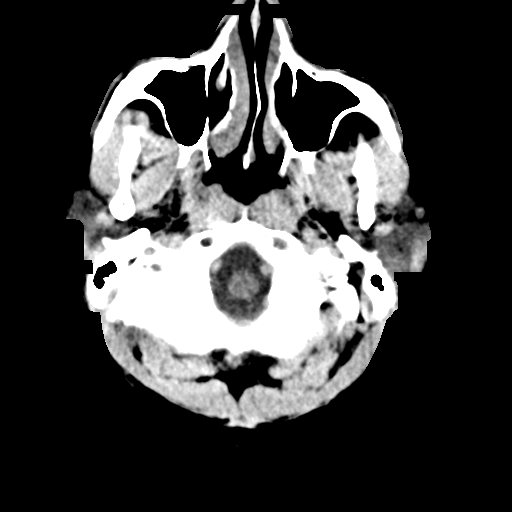
[im 5/37  bone]
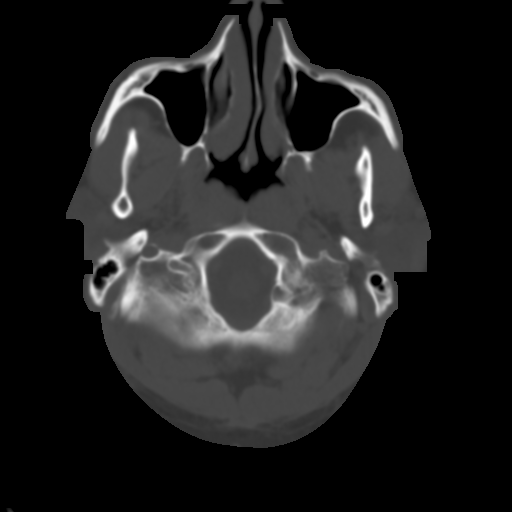
[im 10/37  brain]
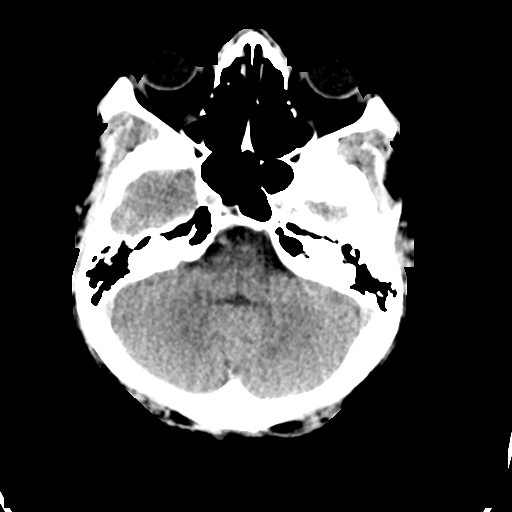
[im 14/37  brain]
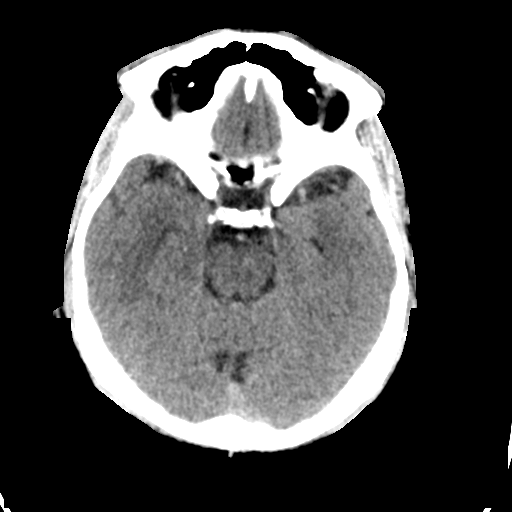
[im 19/37  brain]
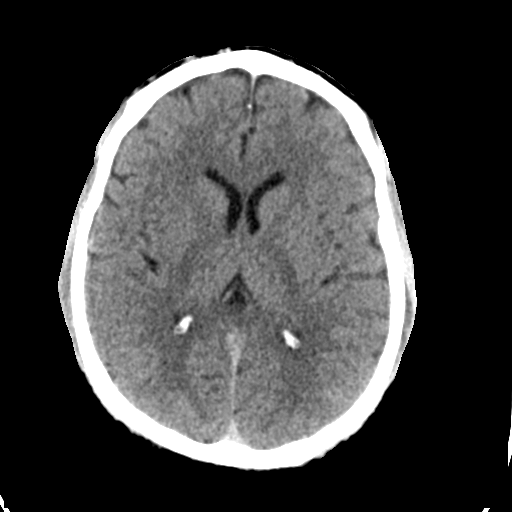
[im 23/37  brain]
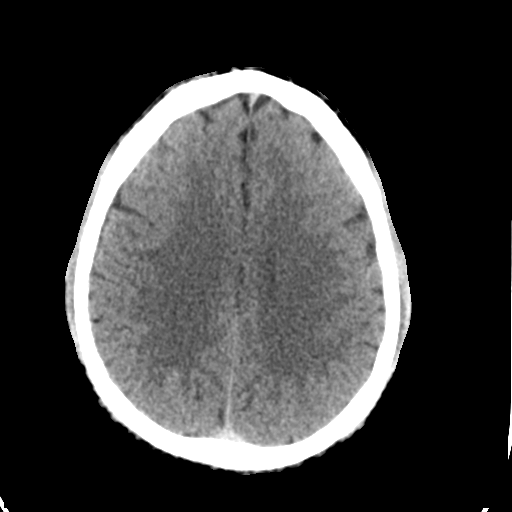
[im 23/37  bone]
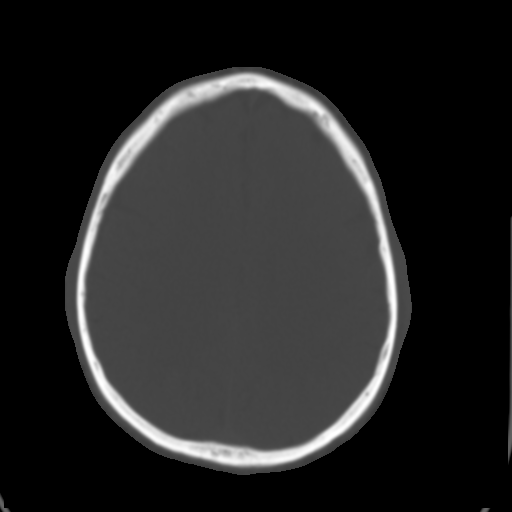
[im 28/37  brain]
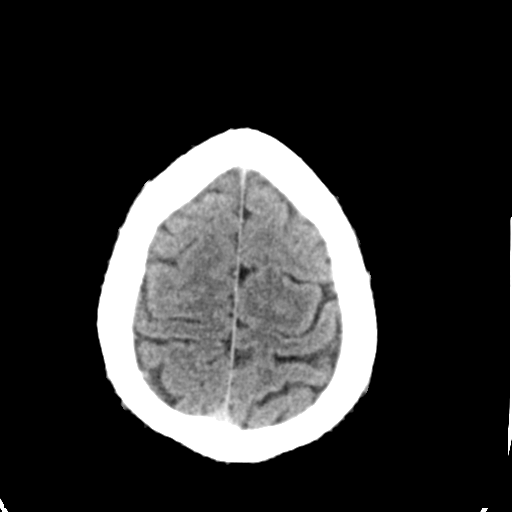
[im 32/37  brain]
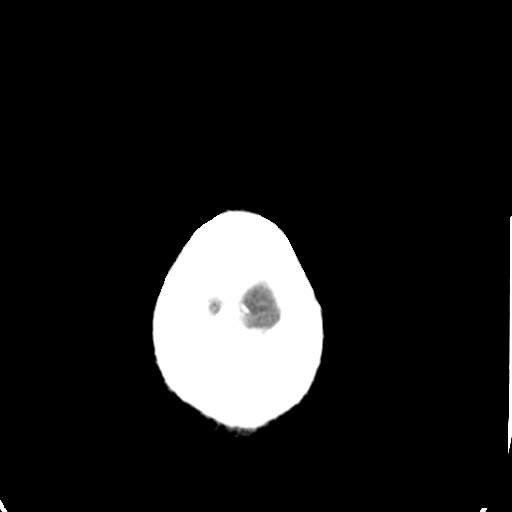

[Series 4: head bone · axial · 0.41mm/px · z∈[-107,-45]mm · 4 of 91 slices shown]
[im 10/91  bone]
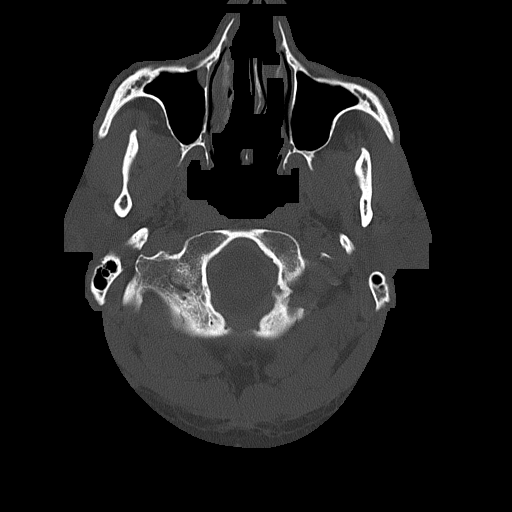
[im 19/91  bone]
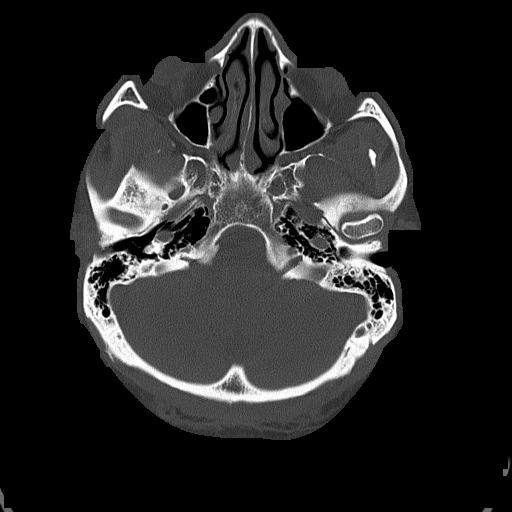
[im 28/91  bone]
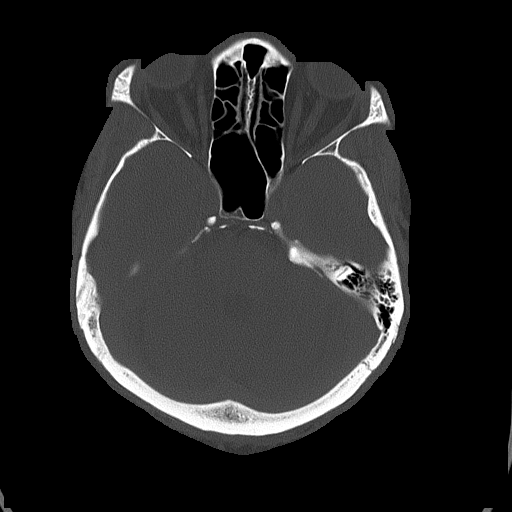
[im 41/91  bone]
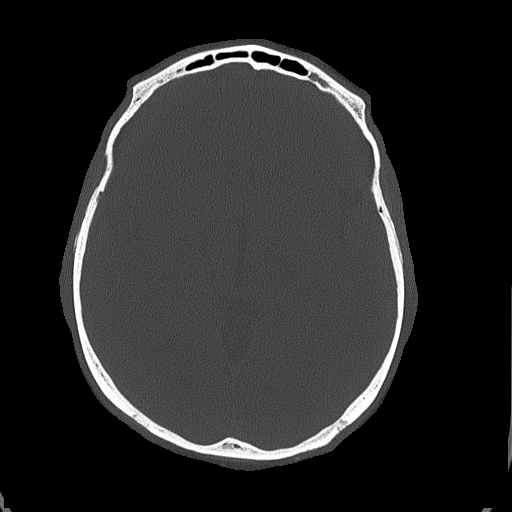

[Series 5: head without cor · coronal · non-contrast · 0.35mm/px · 3 of 67 slices shown]
[im 23/67  brain]
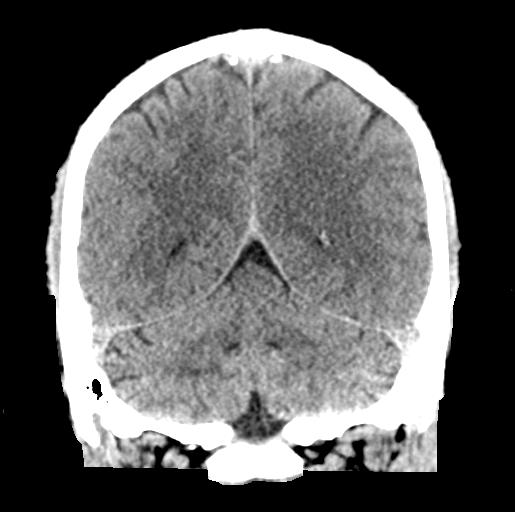
[im 30/67  brain]
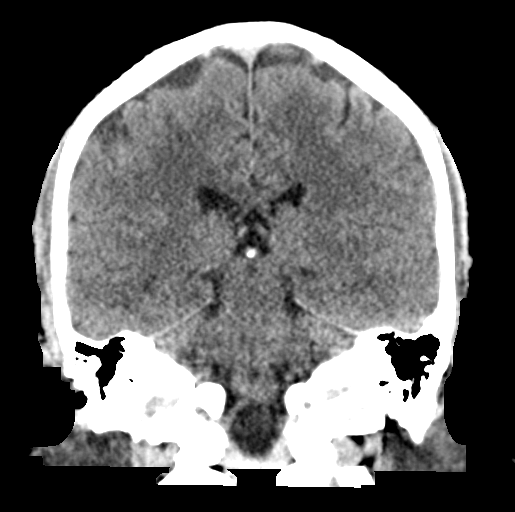
[im 37/67  brain]
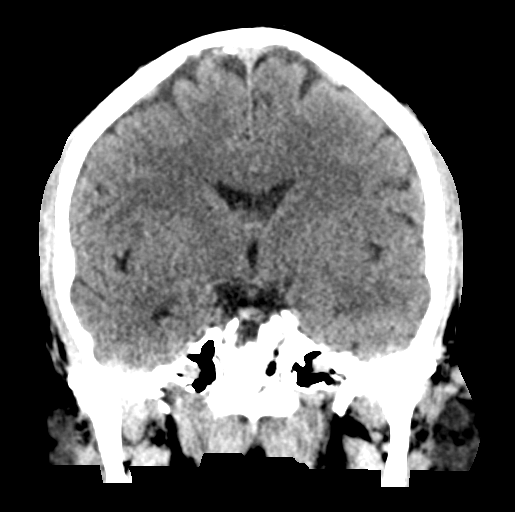

[Series 6: head without sag · sagittal · non-contrast · 0.34mm/px · 3 of 60 slices shown]
[im 20/60  brain]
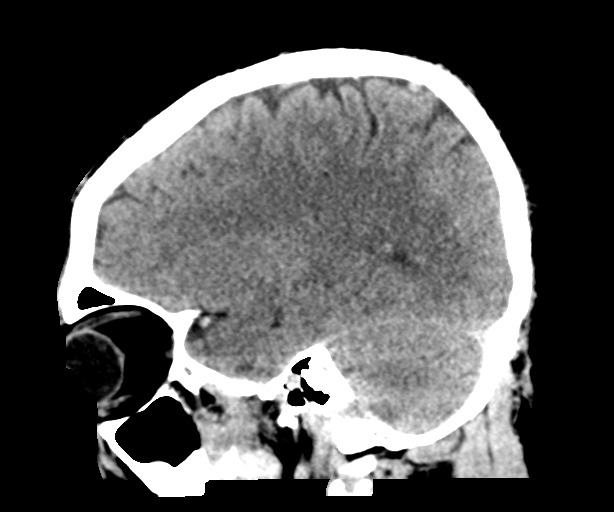
[im 30/60  brain]
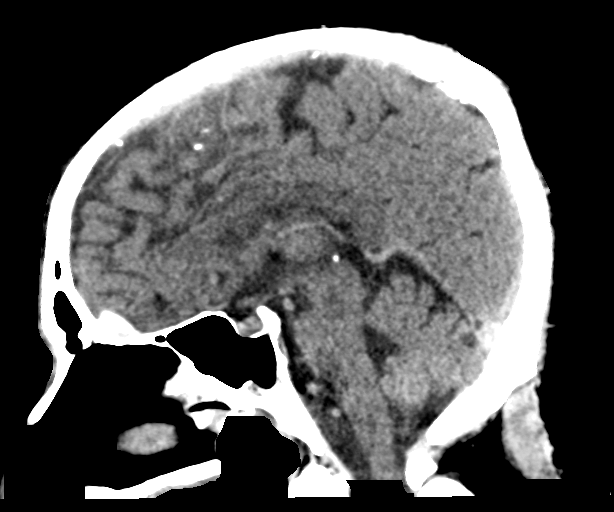
[im 40/60  brain]
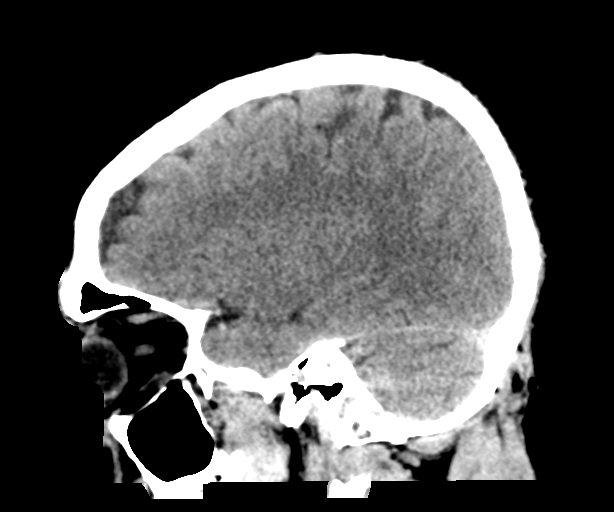

[17 of 47 positions shown; findings below may reference images not displayed]

FINDINGS: Brain: No evidence of acute infarction, hemorrhage, hydrocephalus,
extra-axial collection or mass lesion/mass effect.

The posterior fossa, including the cerebellum, brainstem and fourth
ventricle, is within normal limits. The third and lateral
ventricles, and basal ganglia are unremarkable in appearance. The
cerebral hemispheres are symmetric in appearance, with normal
gray-white differentiation. No mass effect or midline shift is seen.

Vascular: No hyperdense vessel or unexpected calcification.

Skull: There is no evidence of fracture; visualized osseous
structures are unremarkable in appearance.

Sinuses/Orbits: Mild bilateral proptosis is noted. The orbits are
otherwise unremarkable in appearance. The paranasal sinuses and
mastoid air cells are well-aerated.

Other: No significant soft tissue abnormalities are seen.
IMPRESSION: 1. No acute intracranial pathology seen on CT.
2. Mild bilateral proptosis noted.

## 2020-06-25 ENCOUNTER — Ambulatory Visit (HOSPITAL_COMMUNITY)
Admission: EM | Admit: 2020-06-25 | Discharge: 2020-06-25 | Disposition: A | Discharge status: Home / Self Care | Payer: Non-veteran care | Attending: Family Medicine | Admitting: Family Medicine

## 2020-06-25 ENCOUNTER — Encounter (HOSPITAL_COMMUNITY): Payer: Self-pay | Admitting: Emergency Medicine

## 2020-06-25 ENCOUNTER — Other Ambulatory Visit: Payer: Self-pay

## 2020-06-25 DIAGNOSIS — H9201 Otalgia, right ear: Secondary | ICD-10-CM | POA: Diagnosis not present

## 2020-06-25 DIAGNOSIS — R519 Headache, unspecified: Secondary | ICD-10-CM | POA: Diagnosis not present

## 2020-06-25 DIAGNOSIS — I10 Essential (primary) hypertension: Secondary | ICD-10-CM | POA: Diagnosis not present

## 2020-06-25 MED ORDER — AMLODIPINE BESYLATE 10 MG PO TABS: 10.0000 mg | ORAL_TABLET | Freq: Every day | ORAL | 2 refills | Status: DC

## 2020-06-25 NOTE — ED Triage Notes (Addendum)
Pt presents with headache, dizziness, and right ear pain. States bent over yesterday and ear popped.   Pt states has not taken his BP medication in over a month.   Charge RN and MD notified about pts BP.

## 2020-06-25 NOTE — Discharge Instructions (Signed)
Your blood pressure was noted to be elevated during your visit today. If you are currently taking medication for high blood pressure, please ensure you are taking this as directed. If you do not have a history of high blood pressure and your blood pressure remains persistently elevated, you may need to begin taking a medication at some point. You may return here within the next few days to recheck if unable to see your primary care provider or if you do not have a one.  BP (!) 190/134 (BP Location: Right Arm)   Pulse 64   Temp 97.9 F (36.6 C) (Oral)   Resp 18   SpO2 97%   BP Readings from Last 3 Encounters:  06/25/20 (!) 190/134  06/26/16 (!) 156/103  12/31/15 160/96

## 2020-06-27 NOTE — ED Provider Notes (Signed)
[facility]   [id number] 06/25/20 Arrival Time: 1040  ASSESSMENT & PLAN:  1. Otalgia, right   2. Nonintractable headache, unspecified chronicity pattern, unspecified headache type   3. Elevated blood pressure reading in office with diagnosis of hypertension    No sign of ear infection. HA is resolving. OTC analgesics if needed. Has been out of BP med for awhile. Desires to start back. Admits regular non-compliance with taking medications.  Meds ordered this encounter  Medications  . amLODipine (NORVASC) 10 MG tablet    Sig: Take 1 tablet (10 mg total) by mouth daily.    Dispense:  30 tablet    Refill:  2     Discharge Instructions      Your blood pressure was noted to be elevated during your visit today. If you are currently taking medication for high blood pressure, please ensure you are taking this as directed. If you do not have a history of high blood pressure and your blood pressure remains persistently elevated, you may need to begin taking a medication at some point. You may return here within the next few days to recheck if unable to see your primary care provider or if you do not have a one.  BP (!) 190/134 (BP Location: Right Arm)   Pulse 64   Temp 97.9 F (36.6 C) (Oral)   Resp 18   SpO2 97%   BP Readings from Last 3 Encounters:  06/25/20 (!) 190/134  06/26/16 (!) 156/103  12/31/15 160/96        Follow-up Information    Schedule an appointment as soon as possible for a visit  with [facility]   Why: To re-evaluate your blood pressure. Contact information: [address] [facility] [place] [address] [phone]              Reviewed expectations re: course of current medical issues. Questions answered. Outlined signs and symptoms indicating need for more acute intervention. Patient verbalized understanding. After Visit Summary given.   SUBJECTIVE: History from: patient.  Richard Barrera is a 55 y.o. male  who presents with complaint of right otalgia; mild pressure feeling; several days; gradual onset; normal hearing; without drainage or bleeding. No injury. Also with mild frontal headache for a few days; dull. Not worst HA of his life. Afebirle. Normal PO intake without n/v. No head injury.  Social History   Tobacco Use  Smoking Status Former Smoker  . Packs/day: 0.50  . Types: Cigarettes  . Quit date: 03/31/2002  . Years since quitting: 18.2  Smokeless Tobacco Never Used    Increased blood pressure noted today. Reports that he has been tx for HTN in past; does not take meds regularly. Needs refill.  He reports no chest pain on exertion, no dyspnea on exertion, no swelling of ankles, no orthostatic dizziness or lightheadedness, no orthopnea or paroxysmal nocturnal dyspnea, no palpitations and no intermittent claudication symptoms.    OBJECTIVE:  Vitals:   06/25/20 1115  BP: (!) 190/134  Pulse: 64  Resp: 18  Temp: 97.9 F (36.6 C)  TempSrc: Oral  SpO2: 97%     General appearance: alert; appears fatigued Ear Canal: normal TM: bilateral TMs without erythema; mild fluid behind; no signs of infection Neck: supple without LAD CV: RRR Lungs: unlabored respirations, symmetrical air entry; cough: absent; no respiratory distress Skin: warm and dry Psychological: alert and cooperative; normal mood and affect  No Known Allergies  Past Medical History:  Diagnosis Date  . Hypertension   .  Kidney stone    History reviewed. No pertinent family history. Social History   Socioeconomic History  . Marital status: Single    Spouse name: Not on file  . Number of children: Not on file  . Years of education: Not on file  . Highest education level: Not on file  Occupational History  . Not on file  Tobacco Use  . Smoking status: Former Smoker    Packs/day: 0.50    Types: Cigarettes    Quit date: 03/31/2002    Years since quitting: 18.2  . Smokeless tobacco: Never Used  Substance  and Sexual Activity  . Alcohol use: Yes    Comment: Socially   . Drug use: Yes    Types: Marijuana  . Sexual activity: Not on file  Other Topics Concern  . Not on file  Social History Narrative  . Not on file   Social Determinants of Health   Financial Resource Strain: Not on file  Food Insecurity: Not on file  Transportation Needs: Not on file  Physical Activity: Not on file  Stress: Not on file  Social Connections: Not on file  Intimate Partner Violence: Not on file            Mardella Layman, MD 06/27/20 [phone]

## 2020-07-23 ENCOUNTER — Other Ambulatory Visit: Payer: Self-pay

## 2020-07-23 ENCOUNTER — Emergency Department (HOSPITAL_COMMUNITY): Payer: No Typology Code available for payment source

## 2020-07-23 ENCOUNTER — Emergency Department (HOSPITAL_COMMUNITY)
Admission: EM | Admit: 2020-07-23 | Discharge: 2020-07-23 | Disposition: A | Discharge status: Home / Self Care | Payer: No Typology Code available for payment source | Attending: Emergency Medicine | Admitting: Emergency Medicine

## 2020-07-23 ENCOUNTER — Encounter (HOSPITAL_COMMUNITY): Payer: Self-pay

## 2020-07-23 DIAGNOSIS — Z87891 Personal history of nicotine dependence: Secondary | ICD-10-CM | POA: Insufficient documentation

## 2020-07-23 DIAGNOSIS — I10 Essential (primary) hypertension: Secondary | ICD-10-CM | POA: Diagnosis not present

## 2020-07-23 DIAGNOSIS — R1013 Epigastric pain: Secondary | ICD-10-CM | POA: Diagnosis present

## 2020-07-23 DIAGNOSIS — Z79899 Other long term (current) drug therapy: Secondary | ICD-10-CM | POA: Diagnosis not present

## 2020-07-23 LAB — COMPREHENSIVE METABOLIC PANEL
ALT: 46 U/L — ABNORMAL HIGH (ref 0–44)
AST: 37 U/L (ref 15–41)
Albumin: 4 g/dL (ref 3.5–5.0)
Alkaline Phosphatase: 61 U/L (ref 38–126)
Anion gap: 8 (ref 5–15)
BUN: 10 mg/dL (ref 6–20)
CO2: 28 mmol/L (ref 22–32)
Calcium: 9.6 mg/dL (ref 8.9–10.3)
Chloride: 102 mmol/L (ref 98–111)
Creatinine, Ser: 1.15 mg/dL (ref 0.61–1.24)
GFR, Estimated: >60 mL/min (ref >60)
Glucose, Bld: 96 mg/dL (ref 70–99)
Potassium: 4.2 mmol/L (ref 3.5–5.1)
Sodium: 138 mmol/L (ref 135–145)
Total Bilirubin: 0.4 mg/dL (ref 0.3–1.2)
Total Protein: 6.8 g/dL (ref 6.5–8.1)

## 2020-07-23 LAB — CBC WITH DIFFERENTIAL/PLATELET
Abs Immature Granulocytes: 0.02 10*3/uL (ref 0.00–0.07)
Basophils Absolute: 0 10*3/uL (ref 0.0–0.1)
Basophils Relative: 1 %
Eosinophils Absolute: 0.1 10*3/uL (ref 0.0–0.5)
Eosinophils Relative: 2 %
HCT: 52.2 % — ABNORMAL HIGH (ref 39.0–52.0)
Hemoglobin: 17.3 g/dL — ABNORMAL HIGH (ref 13.0–17.0)
Immature Granulocytes: 1 %
Lymphocytes Relative: 30 %
Lymphs Abs: 1.3 10*3/uL (ref 0.7–4.0)
MCH: 29.7 pg (ref 26.0–34.0)
MCHC: 33.1 g/dL (ref 30.0–36.0)
MCV: 89.5 fL (ref 80.0–100.0)
Monocytes Absolute: 0.4 10*3/uL (ref 0.1–1.0)
Monocytes Relative: 8 %
Neutro Abs: 2.5 10*3/uL (ref 1.7–7.7)
Neutrophils Relative %: 58 %
Platelets: 220 10*3/uL (ref 150–400)
RBC: 5.83 MIL/uL — ABNORMAL HIGH (ref 4.22–5.81)
RDW: 14.2 % (ref 11.5–15.5)
WBC: 4.3 10*3/uL (ref 4.0–10.5)
nRBC: 0 % (ref 0.0–0.2)

## 2020-07-23 LAB — TROPONIN I (HIGH SENSITIVITY): Troponin I (High Sensitivity): 6 ng/L (ref <18)

## 2020-07-23 LAB — LIPASE, BLOOD: Lipase: 105 U/L — ABNORMAL HIGH (ref 11–51)

## 2020-07-23 NOTE — ED Provider Notes (Signed)
[facility] EMERGENCY DEPARTMENT Provider Note   CSN: [id number] Arrival date & time: 07/23/20  1034     History Chief Complaint  Patient presents with  . Abdominal Pain    Richard Barrera is a 55 y.o. male.  HPI   Patient with no significant medical history presents to the emergency department with chief complaint of epigastric pain.  Stated started suddenly yesterday, is not associated with nausea, vomiting, diarrhea, constipation, urinary symptoms.  He states pain is worsened with movement, especially when he stands up or stretches out his back, he has never had  this pain in the past.  Denies food or liquids making this pain worse.  He denies abdominal history, has never had issues with his gallbladder or his liver, denies history of stomach ulcers, does not drink alcohol or smoke nicotine, has no history of aortic aneurysms, connective tissue disorders, denies history of pancreatitis.   Patient currently has no pain this time.  Patient denies headaches, fevers, chills, shortness breath, chest pain, abdominal pain, nausea, vomiting, diarrhea, worsening pedal edema.  Past Medical History:  Diagnosis Date  . Hypertension   . Kidney stone     There are no problems to display for this patient.   Past Surgical History:  Procedure Laterality Date  . BACK SURGERY         History reviewed. No pertinent family history.  Social History   Tobacco Use  . Smoking status: Former Smoker    Packs/day: 0.50    Types: Cigarettes    Quit date: 03/31/2002    Years since quitting: 18.3  . Smokeless tobacco: Never Used  Substance Use Topics  . Alcohol use: Yes    Comment: Socially   . Drug use: Yes    Types: Marijuana    Home Medications Prior to Admission medications   Medication Sig Start Date End Date Taking? Authorizing Provider  amitriptyline (ELAVIL) 25 MG tablet Take 12.5 mg by mouth 2 (two) times daily.    [provider]  amLODipine (NORVASC)  10 MG tablet Take 1 tablet (10 mg total) by mouth daily. 06/25/20   Mardella Layman, MD  diphenhydrAMINE (BENADRYL) 25 mg capsule Take 25 mg by mouth at bedtime.    [provider]  docusate sodium (COLACE) 100 MG capsule Take 1 capsule (100 mg total) by mouth every 12 (twelve) hours. 12/31/15   Azalia Bilis, MD  mirtazapine (REMERON) 7.5 MG tablet Take 7.5 mg by mouth at bedtime as needed (for pain/sleep).    [provider]  naproxen (NAPROSYN) 500 MG tablet Take 1 tablet (500 mg total) by mouth 2 (two) times daily with a meal. Patient taking differently: Take 500 mg by mouth 2 (two) times daily as needed for mild pain.  06/15/14   Everlene Farrier, PA-C  omeprazole (PRILOSEC) 40 MG capsule Take 40 mg by mouth daily.    [provider]  ondansetron (ZOFRAN ODT) 4 MG disintegrating tablet Take 1 tablet (4 mg total) by mouth every 8 (eight) hours as needed for nausea or vomiting. 06/26/16   Alvina Chou, PA    Allergies    Patient has no known allergies.  Review of Systems   Review of Systems  Constitutional: Negative for chills and fever.  HENT: Negative for congestion and sore throat.   Respiratory: Negative for shortness of breath.   Cardiovascular: Negative for chest pain.  Gastrointestinal: Negative for abdominal pain, diarrhea, nausea and vomiting.  Genitourinary: Negative for enuresis.  Musculoskeletal:  Negative for back pain.  Skin: Negative for rash.  Neurological: Negative for headaches.  Hematological: Does not bruise/bleed easily.    Physical Exam Updated Vital Signs BP (!) 145/95 (BP Location: Left Arm)   Pulse 65   Temp 98.4 F (36.9 C) (Oral)   Resp 16   SpO2 100%   Physical Exam Vitals and nursing note reviewed.  Constitutional:      General: He is not in acute distress.    Appearance: He is not ill-appearing.  HENT:     Head: Normocephalic and atraumatic.     Nose: No congestion.  Eyes:     Conjunctiva/sclera: Conjunctivae  normal.  Cardiovascular:     Rate and Rhythm: Normal rate and regular rhythm.     Pulses: Normal pulses.     Heart sounds: No murmur heard. No friction rub. No gallop.   Pulmonary:     Effort: No respiratory distress.     Breath sounds: No wheezing, rhonchi or rales.  Abdominal:     Palpations: Abdomen is soft.     Tenderness: There is no abdominal tenderness. There is no right CVA tenderness or left CVA tenderness.     Comments: Abdomen was visualized, is nondistended, normoactive bowel sounds, dull to percussion, abdomen was soft nontender to palpation, negative Murphy sign, McBurney point, no guarding, rebound tenderness or peritoneal sign noted.  Skin:    General: Skin is warm and dry.  Neurological:     Mental Status: He is alert.  Psychiatric:        Mood and Affect: Mood normal.     ED Results / Procedures / Treatments   Labs (all labs ordered are listed, but only abnormal results are displayed) Labs Reviewed  CBC WITH DIFFERENTIAL/PLATELET - Abnormal; Notable for the following components:      Result Value   RBC 5.83 (*)    Hemoglobin 17.3 (*)    HCT 52.2 (*)    All other components within normal limits  COMPREHENSIVE METABOLIC PANEL - Abnormal; Notable for the following components:   ALT 46 (*)    All other components within normal limits  LIPASE, BLOOD - Abnormal; Notable for the following components:   Lipase 105 (*)    All other components within normal limits  URINALYSIS, ROUTINE W REFLEX MICROSCOPIC  TROPONIN I (HIGH SENSITIVITY)  TROPONIN I (HIGH SENSITIVITY)    EKG EKG Interpretation  Date/Time:  Monday July 23 2020 10:42:35 EDT Ventricular Rate:  64 PR Interval:  158 QRS Duration: 78 QT Interval:  410 QTC Calculation: 422 R Axis:   61 Text Interpretation: Normal sinus rhythm with sinus arrhythmia Left ventricular hypertrophy with repolarization abnormality ( Sokolow-Lyon , Romhilt-Estes ) Cannot rule out Inferior infarct , age undetermined  Abnormal ECG Similar to 4 years ago, but may have slightly worsening of lateral T wave inversion. Confirmed by Benjiman Core [phone]) on 07/23/2020 11:16:51 AM   Radiology DG Chest Port 1 View  Result Date: 07/23/2020 CLINICAL DATA:  Epigastric pain. No shortness of breath or reported chest pain. No prior surgeries. EXAM: PORTABLE CHEST 1 VIEW COMPARISON:  March of 2016 FINDINGS: Trachea midline. Cardiomediastinal contours and hilar structures are normal. Lungs are clear.  No sign of pleural effusion. On limited assessment no acute skeletal process. IMPRESSION: No acute cardiopulmonary disease. Electronically Signed   By: Donzetta Kohut M.D.   On: 07/23/2020 12:22    Procedures Procedures   Medications Ordered in ED Medications - No data to display  ED  Course  I have reviewed the triage vital signs and the nursing notes.  Pertinent labs & imaging results that were available during my care of the patient were reviewed by me and considered in my medical decision making (see chart for details).    MDM Rules/Calculators/A&P                         Initial impression-patient presents with abdominal pain, he is alert, does not appear in acute chest, vital signs reassuring.  Will obtain basic lab work, EKG, chest x-ray, troponins and reassess.  Work-up-CBC unremarkable, CMP unremarkable, troponin 6, lipase 105, chest x-ray negative for acute findings.  EKG without signs of ischemia nonspecific T wave inversions  Reassessment went to update patient on current lab work, patient has eloped, multiple times were made to find the patient without success.  Rule out- I have low suspicion for ACS as history is atypical, patient has no cardiac history, EKG was sinus rhythm without signs of ischemia, first troponin is 6, would defer second opponent as patient been chest pain-free for greater than 12 hours, would expect elevation if ACS was present.  Low suspicion for PE as patient denies pleuritic chest  pain, shortness of breath, patient denies leg pain, no pedal edema noted on exam, vital signs reassuring.  low suspicion for AAA or aortic dissection as history is atypical, patient has low risk factors.  Low suspicion for ruptured stomach ulcer as abdomen is nondistended, dull to percussion, no peritoneal sign my exam.  Low suspicion for pancreatitis patient is no history of this, lipase is only slightly elevated 107, he had no epigastric pain on my exam.   Plan-patient has eloped, if  patient  Would like to be evaluated we would be happy to see him if he comes back.  Final Clinical Impression(s) / ED Diagnoses Final diagnoses:  None    Rx / DC Orders ED Discharge Orders    None       Barnie Del 07/23/20 1408    Benjiman Core, MD 07/23/20 1555

## 2020-07-23 NOTE — ED Triage Notes (Signed)
Emergency Medicine Provider Triage Evaluation Note  Richard Barrera , a 55 y.o. male  was evaluated in triage.  Pt complains of epigastric pain. Pain started yesterday when he stood up, worsening pain with movement. Hx of GERD, states this felt different.  No significant cardiac hx.  Review of Systems  Positive: Epigastric pain Negative: Fever, cp, sob, cough, n/v/d  Physical Exam  BP (!) 145/95 (BP Location: Left Arm)   Pulse 65   Temp 98.4 F (36.9 C) (Oral)   Resp 16   SpO2 100%  Gen:   Awake, no distress   HEENT:  Atraumatic  Resp:  Normal effort  Cardiac:  Normal rate  Abd:   Tenderness to epigastric, no RUQ or LUQ pain MSK:   Moves extremities without difficulty  Neuro:  Speech clear   Medical Decision Making  Medically screening exam initiated at 10:56 AM.  Appropriate orders placed.  Robbert Langlinais was informed that the remainder of the evaluation will be completed by another provider, this initial triage assessment does not replace that evaluation, and the importance of remaining in the ED until their evaluation is complete.  Clinical Impression  Epigastric pain   Fayrene Helper, PA-C 07/23/20 1058

## 2020-07-23 NOTE — ED Triage Notes (Signed)
Pt reports epigastric pain starting last night .

## 2020-07-23 NOTE — ED Notes (Signed)
Went to update pt on labs and pt was not in room , PA notified

## 2020-12-28 ENCOUNTER — Ambulatory Visit (HOSPITAL_COMMUNITY)
Admission: EM | Admit: 2020-12-28 | Discharge: 2020-12-28 | Disposition: A | Discharge status: Home / Self Care | Payer: No Typology Code available for payment source | Attending: Emergency Medicine | Admitting: Emergency Medicine

## 2020-12-28 ENCOUNTER — Other Ambulatory Visit: Payer: Self-pay

## 2020-12-28 ENCOUNTER — Encounter (HOSPITAL_COMMUNITY): Payer: Self-pay | Admitting: Emergency Medicine

## 2020-12-28 DIAGNOSIS — L2489 Irritant contact dermatitis due to other agents: Secondary | ICD-10-CM | POA: Diagnosis not present

## 2020-12-28 MED ORDER — FAMOTIDINE 20 MG PO TABS: 20.0000 mg | ORAL_TABLET | Freq: Two times a day (BID) | ORAL | 0 refills | Status: AC

## 2020-12-28 MED ORDER — PREDNISONE 10 MG (21) PO TBPK: ORAL_TABLET | Freq: Every day | ORAL | 0 refills | Status: DC

## 2020-12-28 MED ORDER — TRIAMCINOLONE ACETONIDE 0.025 % EX OINT: 1.0000 "application " | TOPICAL_OINTMENT | Freq: Two times a day (BID) | CUTANEOUS | 0 refills | Status: DC

## 2020-12-28 NOTE — ED Triage Notes (Signed)
Pt put Just For beard product on beard Wed evening, used before. Thursday woke up with rash that is very itchy, irritating and drainage. Tried hydrocortisone cream, peroxide and not helping.

## 2020-12-28 NOTE — Discharge Instructions (Addendum)
Purchase Benadryl generic diphenhydramine is okay to use) and take it according to package directions. I have prescribed famotidine (generic for Pepcid) but you can also purchase over the counter pepcid (or famotidine) and take 20mg  twice a day until rash goes away.

## 2020-12-28 NOTE — ED Provider Notes (Signed)
[facility]    CSN: [id number] Arrival date & time: 12/28/20  1104      History   Chief Complaint Chief Complaint  Patient presents with   Rash   Allergic Reaction    HPI Deshun Sedivy is a 55 y.o. male.  2 nights ago patient used just for beard product on his beard area.  The next day, yesterday, he woke up with an itchy, irritating, draining rash in his beard area.  Has used hydrocortisone cream and peroxide without relief of symptoms.  Has never had this before.  In regards to his elevated blood pressure today, patient reports he did take his blood pressure medicine today.  He reports it is common for his blood pressure to be elevated in healthcare settings.  He checks his blood pressure regularly at home and it is typically 130s/80s.   Rash Associated symptoms: no fever   Allergic Reaction Presenting symptoms: rash    Past Medical History:  Diagnosis Date   Hypertension    Kidney stone     There are no problems to display for this patient.   Past Surgical History:  Procedure Laterality Date   BACK SURGERY         Home Medications    Prior to Admission medications   Medication Sig Start Date End Date Taking? Authorizing Provider  famotidine (PEPCID) 20 MG tablet Take 1 tablet (20 mg total) by mouth 2 (two) times daily. 12/28/20  Yes Cathlyn Parsons, NP  predniSONE (STERAPRED UNI-PAK 21 TAB) 10 MG (21) TBPK tablet Take by mouth daily. Take 6 tabs by mouth daily  for 2 days, then 5 tabs for 2 days, then 4 tabs for 2 days, then 3 tabs for 2 days, 2 tabs for 2 days, then 1 tab by mouth daily for 2 days 12/28/20  Yes Tatijana Bierly, Marzella Schlein, NP  triamcinolone (KENALOG) 0.025 % ointment Apply 1 application topically 2 (two) times daily. 12/28/20  Yes Cathlyn Parsons, NP  amitriptyline (ELAVIL) 25 MG tablet Take 12.5 mg by mouth 2 (two) times daily.    [provider]  amLODipine (NORVASC) 10 MG tablet Take 1 tablet (10 mg total) by mouth daily. 06/25/20    Mardella Layman, MD  diphenhydrAMINE (BENADRYL) 25 mg capsule Take 25 mg by mouth at bedtime.    [provider]  docusate sodium (COLACE) 100 MG capsule Take 1 capsule (100 mg total) by mouth every 12 (twelve) hours. 12/31/15   Azalia Bilis, MD  mirtazapine (REMERON) 7.5 MG tablet Take 7.5 mg by mouth at bedtime as needed (for pain/sleep).    [provider]  naproxen (NAPROSYN) 500 MG tablet Take 1 tablet (500 mg total) by mouth 2 (two) times daily with a meal. Patient taking differently: Take 500 mg by mouth 2 (two) times daily as needed for mild pain.  06/15/14   Everlene Farrier, PA-C  omeprazole (PRILOSEC) 40 MG capsule Take 40 mg by mouth daily.    [provider]  ondansetron (ZOFRAN ODT) 4 MG disintegrating tablet Take 1 tablet (4 mg total) by mouth every 8 (eight) hours as needed for nausea or vomiting. 06/26/16   Alvina Chou, PA    Family History History reviewed. No pertinent family history.  Social History Social History   Tobacco Use   Smoking status: Former    Packs/day: 0.50    Types: Cigarettes    Quit date: 03/31/2002    Years since quitting: 18.7   Smokeless tobacco:  Never  Substance Use Topics   Alcohol use: Yes    Comment: Socially    Drug use: Yes    Types: Marijuana     Allergies   Patient has no known allergies.   Review of Systems Review of Systems  Constitutional:  Negative for chills and fever.  Skin:  Positive for rash.    Physical Exam Triage Vital Signs ED Triage Vitals  Enc Vitals Group     BP 12/28/20 1130 (!) 178/105     Pulse Rate 12/28/20 1130 62     Resp 12/28/20 1130 17     Temp 12/28/20 1130 98 F (36.7 C)     Temp Source 12/28/20 1130 Oral     SpO2 12/28/20 1130 98 %     Weight --      Height --      Head Circumference --      Peak Flow --      Pain Score 12/28/20 1128 0     Pain Loc --      Pain Edu? --      Excl. in GC? --    No data found.  Updated Vital Signs BP (!) 178/105 (BP  Location: Left Arm) Comment: hasnt had HTN meds today  Pulse 62   Temp 98 F (36.7 C) (Oral)   Resp 17   SpO2 98%   Visual Acuity Right Eye Distance:   Left Eye Distance:   Bilateral Distance:    Right Eye Near:   Left Eye Near:    Bilateral Near:     Physical Exam Constitutional:      Appearance: Normal appearance.  Pulmonary:     Effort: Pulmonary effort is normal.  Skin:    Comments: Beard area with edema, erythema, papules and vesicles, consistent with contact dermatitis.  Neurological:     Mental Status: He is alert.     UC Treatments / Results  Labs (all labs ordered are listed, but only abnormal results are displayed) Labs Reviewed - No data to display  EKG   Radiology No results found.  Procedures Procedures (including critical care time)  Medications Ordered in UC Medications - No data to display  Initial Impression / Assessment and Plan / UC Course  I have reviewed the triage vital signs and the nursing notes.  Pertinent labs & imaging results that were available during my care of the patient were reviewed by me and considered in my medical decision making (see chart for details).  Contact dermatitis from just for beard product.  Advised patient not to use that product again.  Prescribed prednisone, triamcinolone ointment, Pepcid, and recommended Benadryl.   Final Clinical Impressions(s) / UC Diagnoses   Final diagnoses:  Irritant contact dermatitis due to other agents     Discharge Instructions      Purchase Benadryl generic diphenhydramine is okay to use) and take it according to package directions. I have prescribed famotidine (generic for Pepcid) but you can also purchase over the counter pepcid (or famotidine) and take 20mg  twice a day until rash goes away.    ED Prescriptions     Medication Sig Dispense Auth. Provider   triamcinolone (KENALOG) 0.025 % ointment Apply 1 application topically 2 (two) times daily. 30 g ,  NP   predniSONE (STERAPRED UNI-PAK 21 TAB) 10 MG (21) TBPK tablet Take by mouth daily. Take 6 tabs by mouth daily  for 2 days, then 5 tabs for 2 days, then 4 tabs  for 2 days, then 3 tabs for 2 days, 2 tabs for 2 days, then 1 tab by mouth daily for 2 days 42 tablet Katerra Ingman, Marzella Schlein, NP   famotidine (PEPCID) 20 MG tablet Take 1 tablet (20 mg total) by mouth 2 (two) times daily. 30 tablet Cathlyn Parsons, NP      PDMP not reviewed this encounter.   Cathlyn Parsons, NP 12/28/20 1159

## 2021-05-10 ENCOUNTER — Ambulatory Visit (HOSPITAL_COMMUNITY)
Admission: EM | Admit: 2021-05-10 | Discharge: 2021-05-10 | Disposition: A | Discharge status: Home / Self Care | Payer: No Typology Code available for payment source | Attending: Student | Admitting: Student

## 2021-05-10 ENCOUNTER — Encounter (HOSPITAL_COMMUNITY): Payer: Self-pay

## 2021-05-10 DIAGNOSIS — H6593 Unspecified nonsuppurative otitis media, bilateral: Secondary | ICD-10-CM

## 2021-05-10 MED ORDER — FLUTICASONE PROPIONATE 50 MCG/ACT NA SUSP: 2.0000 | Freq: Every day | NASAL | 2 refills | Status: DC

## 2021-05-10 NOTE — ED Provider Notes (Signed)
[facility]    CSN: [id number] Arrival date & time: 05/10/21  1146      History   Chief Complaint Chief Complaint  Patient presents with   Ear Fullness    HPI Devaunte Gasparini is a 56 y.o. male presenting with bilateral ear fullness x1.5 weeks following viral syndrome. Medical history noncontributory. Describe ear popping. Denies muffled hearing, dizziness, tinnitus, ear pain. Feeling well otherwise. Hasn't attempted interventions at home.  HPI  Past Medical History:  Diagnosis Date   Hypertension    Kidney stone     There are no problems to display for this patient.   Past Surgical History:  Procedure Laterality Date   BACK SURGERY         Home Medications    Prior to Admission medications   Medication Sig Start Date End Date Taking? Authorizing Provider  fluticasone (FLONASE) 50 MCG/ACT nasal spray Place 2 sprays into both nostrils daily. 05/10/21  Yes Rhys Martini, PA-C  amitriptyline (ELAVIL) 25 MG tablet Take 12.5 mg by mouth 2 (two) times daily.    [provider]  amLODipine (NORVASC) 10 MG tablet Take 1 tablet (10 mg total) by mouth daily. 06/25/20   Mardella Layman, MD  diphenhydrAMINE (BENADRYL) 25 mg capsule Take 25 mg by mouth at bedtime.    [provider]  docusate sodium (COLACE) 100 MG capsule Take 1 capsule (100 mg total) by mouth every 12 (twelve) hours. 12/31/15   Azalia Bilis, MD  famotidine (PEPCID) 20 MG tablet Take 1 tablet (20 mg total) by mouth 2 (two) times daily. 12/28/20   Cathlyn Parsons, NP  mirtazapine (REMERON) 7.5 MG tablet Take 7.5 mg by mouth at bedtime as needed (for pain/sleep).    [provider]  naproxen (NAPROSYN) 500 MG tablet Take 1 tablet (500 mg total) by mouth 2 (two) times daily with a meal. Patient taking differently: Take 500 mg by mouth 2 (two) times daily as needed for mild pain.  06/15/14   Everlene Farrier, PA-C  omeprazole (PRILOSEC) 40 MG capsule Take 40 mg by mouth daily.     [provider]  ondansetron (ZOFRAN ODT) 4 MG disintegrating tablet Take 1 tablet (4 mg total) by mouth every 8 (eight) hours as needed for nausea or vomiting. 06/26/16   Espina, Lucita Lora, PA  predniSONE (STERAPRED UNI-PAK 21 TAB) 10 MG (21) TBPK tablet Take by mouth daily. Take 6 tabs by mouth daily  for 2 days, then 5 tabs for 2 days, then 4 tabs for 2 days, then 3 tabs for 2 days, 2 tabs for 2 days, then 1 tab by mouth daily for 2 days Patient not taking: Reported on 05/10/2021 12/28/20   Cathlyn Parsons, NP  triamcinolone (KENALOG) 0.025 % ointment Apply 1 application topically 2 (two) times daily. 12/28/20   Cathlyn Parsons, NP    Family History History reviewed. No pertinent family history.  Social History Social History   Tobacco Use   Smoking status: Former    Packs/day: 0.50    Types: Cigarettes    Quit date: 03/31/2002    Years since quitting: 19.1   Smokeless tobacco: Never  Substance Use Topics   Alcohol use: Yes    Comment: Socially    Drug use: Yes    Types: Marijuana     Allergies   Patient has no known allergies.   Review of Systems Review of Systems  HENT:  Positive for ear pain.  Physical Exam Triage Vital Signs ED Triage Vitals  Enc Vitals Group     BP 05/10/21 1210 (!) 169/104     Pulse Rate 05/10/21 1210 71     Resp 05/10/21 1210 14     Temp 05/10/21 1210 98.3 F (36.8 C)     Temp Source 05/10/21 1210 Oral     SpO2 05/10/21 1210 99 %     Weight --      Height --      Head Circumference --      Peak Flow --      Pain Score 05/10/21 1212 0     Pain Loc --      Pain Edu? --      Excl. in GC? --    No data found.  Updated Vital Signs BP (!) 169/104 (BP Location: Left Arm)    Pulse 71    Temp 98.3 F (36.8 C) (Oral)    Resp 14    SpO2 99%   Visual Acuity Right Eye Distance:   Left Eye Distance:   Bilateral Distance:    Right Eye Near:   Left Eye Near:    Bilateral Near:     Physical Exam Vitals reviewed.   Constitutional:      Appearance: Normal appearance. He is not ill-appearing.  HENT:     Head: Normocephalic and atraumatic.     Right Ear: Hearing, ear canal and external ear normal. No swelling or tenderness. A middle ear effusion is present. There is no impacted cerumen. No mastoid tenderness. Tympanic membrane is not injected, scarred, perforated, erythematous, retracted or bulging.     Left Ear: Hearing, ear canal and external ear normal. No swelling or tenderness. A middle ear effusion is present. There is no impacted cerumen. No mastoid tenderness. Tympanic membrane is not injected, scarred, perforated, erythematous, retracted or bulging.     Mouth/Throat:     Pharynx: Oropharynx is clear. No oropharyngeal exudate or posterior oropharyngeal erythema.  Cardiovascular:     Rate and Rhythm: Normal rate and regular rhythm.     Heart sounds: Normal heart sounds.  Pulmonary:     Effort: Pulmonary effort is normal.     Breath sounds: Normal breath sounds.  Lymphadenopathy:     Cervical: No cervical adenopathy.  Neurological:     General: No focal deficit present.     Mental Status: He is alert and oriented to person, place, and time.  Psychiatric:        Mood and Affect: Mood normal.        Behavior: Behavior normal.        Thought Content: Thought content normal.        Judgment: Judgment normal.     UC Treatments / Results  Labs (all labs ordered are listed, but only abnormal results are displayed) Labs Reviewed - No data to display  EKG   Radiology No results found.  Procedures Procedures (including critical care time)  Medications Ordered in UC Medications - No data to display  Initial Impression / Assessment and Plan / UC Course  I have reviewed the triage vital signs and the nursing notes.  Pertinent labs & imaging results that were available during my care of the patient were reviewed by me and considered in my medical decision making (see chart for  details).     This patient is a very pleasant 56 y.o. year old male presenting with mid ear effusion following viral syndrome. Afebrile, nontachy. Reassuring exam.  Trial of flonase. ED return precautions discussed. Patient verbalizes understanding and agreement.    Final Clinical Impressions(s) / UC Diagnoses   Final diagnoses:  Fluid level behind tympanic membrane of both ears     Discharge Instructions      -Flonase nasal steroid: place 2 sprays into both nostrils in the morning and at bedtime for at least 7 days. Continue for longer if this is helping.    ED Prescriptions     Medication Sig Dispense Auth. Provider   fluticasone (FLONASE) 50 MCG/ACT nasal spray Place 2 sprays into both nostrils daily. 15 mL Rhys Martini, PA-C      PDMP not reviewed this encounter.   Rhys Martini, PA-C 05/10/21 1316

## 2021-05-10 NOTE — ED Triage Notes (Signed)
Pt presents with bilateral ear popping that began 1 month ago.

## 2021-05-10 NOTE — Discharge Instructions (Addendum)
-  Flonase nasal steroid: place 2 sprays into both nostrils in the morning and at bedtime for at least 7 days. Continue for longer if this is helping.  

## 2021-05-14 ENCOUNTER — Telehealth (HOSPITAL_COMMUNITY): Payer: Self-pay

## 2021-05-14 MED ORDER — FLUTICASONE PROPIONATE 50 MCG/ACT NA SUSP: 2.0000 | Freq: Every day | NASAL | 2 refills | Status: AC

## 2021-12-12 IMAGING — DX DG CHEST 1V PORT
1 series · 2 of 2 positions shown · non-contrast
Comparison: Friday May, 2014

CLINICAL DATA: Epigastric pain. No shortness of breath or reported
chest pain. No prior surgeries.

EXAM:
PORTABLE CHEST 1 VIEW

[Series 1: chest · 0.14mm/px · 2 of 2 slices shown]
[im 1/2]
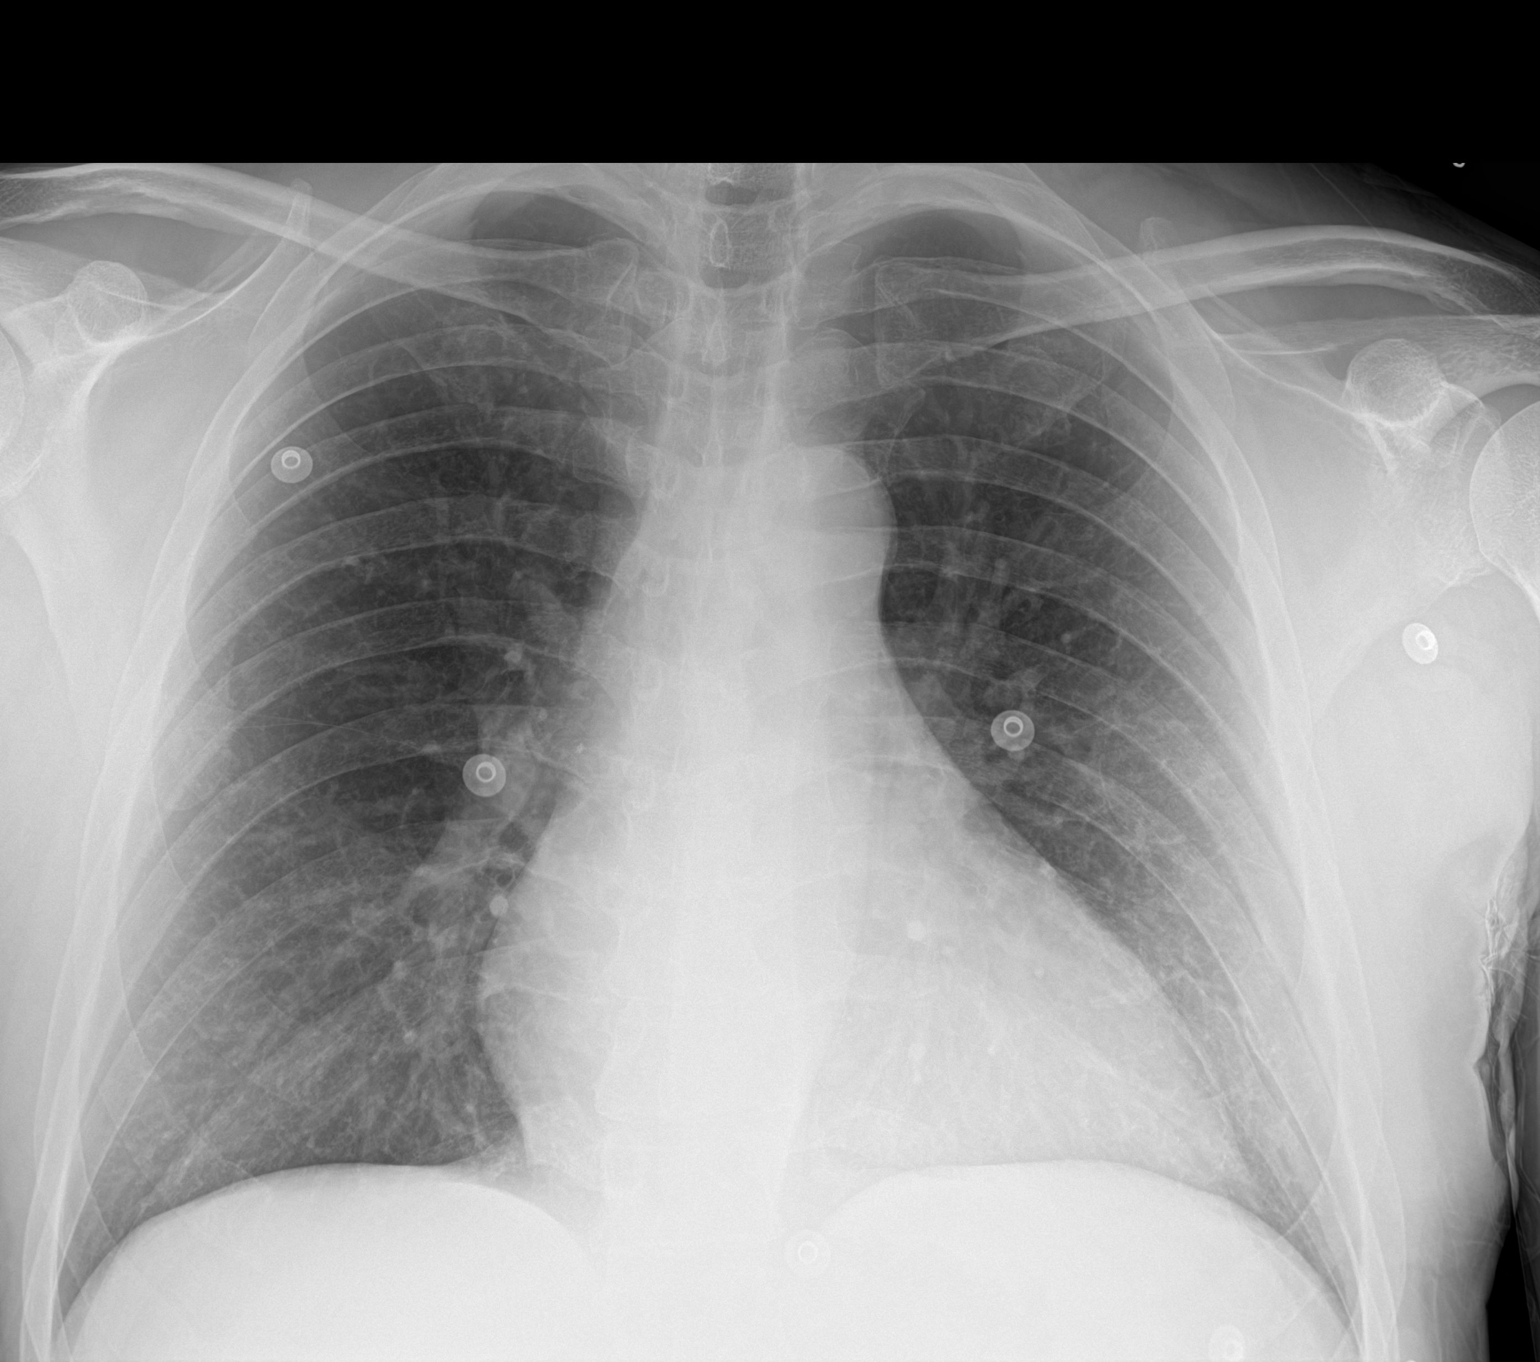
[im 2/2]
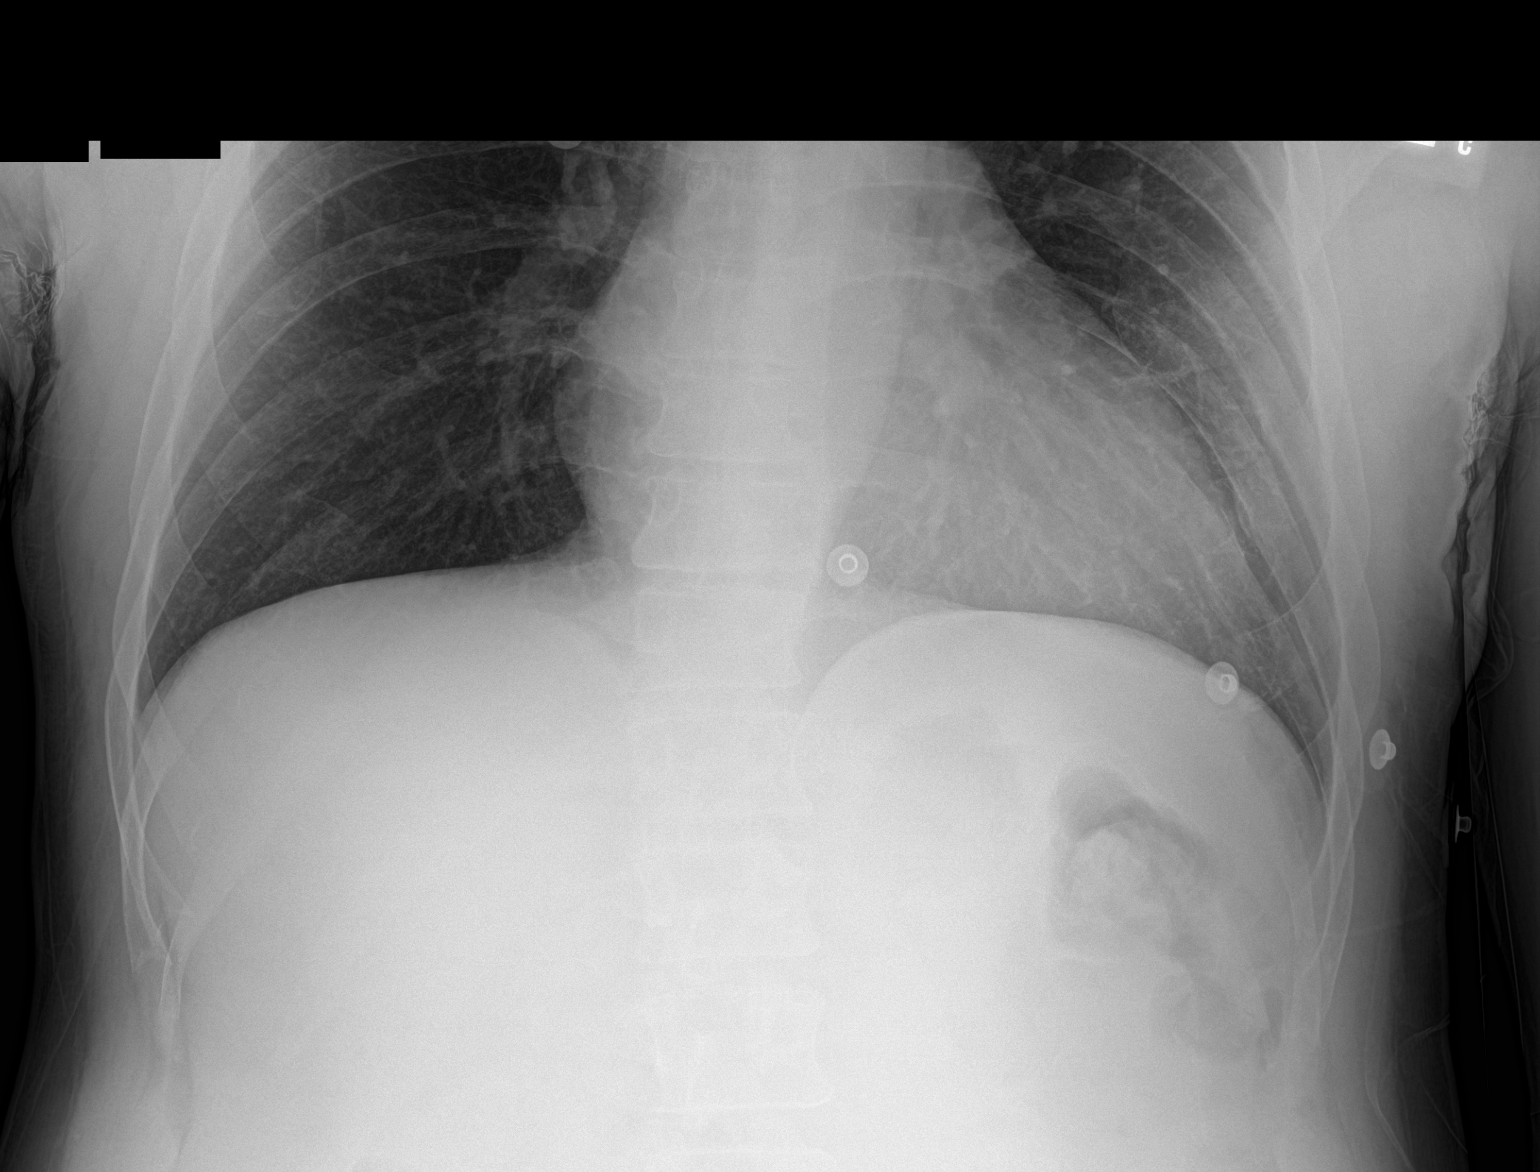

[2 of 2 positions shown; findings below may reference images not displayed]

FINDINGS: Trachea midline. Cardiomediastinal contours and hilar structures are
normal.

Lungs are clear.  No sign of pleural effusion.

On limited assessment no acute skeletal process.
IMPRESSION: No acute cardiopulmonary disease.

## 2022-10-14 ENCOUNTER — Ambulatory Visit (HOSPITAL_COMMUNITY)
Admission: EM | Admit: 2022-10-14 | Discharge: 2022-10-14 | Disposition: A | Discharge status: Home / Self Care | Payer: No Typology Code available for payment source | Attending: Nurse Practitioner | Admitting: Nurse Practitioner

## 2022-10-14 ENCOUNTER — Ambulatory Visit (INDEPENDENT_AMBULATORY_CARE_PROVIDER_SITE_OTHER): Payer: No Typology Code available for payment source

## 2022-10-14 ENCOUNTER — Telehealth (HOSPITAL_COMMUNITY): Payer: Self-pay | Admitting: Nurse Practitioner

## 2022-10-14 ENCOUNTER — Encounter (HOSPITAL_COMMUNITY): Payer: Self-pay | Admitting: Emergency Medicine

## 2022-10-14 DIAGNOSIS — M79644 Pain in right finger(s): Secondary | ICD-10-CM

## 2022-10-14 NOTE — Telephone Encounter (Signed)
  Discussed imaging results of bony spurs. Richard Barrera would like a second opinion with a hand specialist for additional treatment options.

## 2022-10-14 NOTE — Discharge Instructions (Addendum)
Overall your x-ray is pending results.  Will call you with the results once radiology is confirmed.  We do recommend that you use over the counter Tylenol as directed (do not exceed daily limits).   As discussed for the Hypertension the recommendation would be for you to continue to monitor blood pressure at home and follow-up with your primary care provider if your blood pressure remains greater than 140/90.  There are additional antihypertensive medications that may be effective to treat your blood pressure.

## 2022-10-14 NOTE — ED Provider Notes (Addendum)
[facility]    CSN: [id number] Arrival date & time: 10/14/22  7829      History   Chief Complaint Chief Complaint  Patient presents with   Hand Pain    HPI Richard Barrera is a 57 y.o. male.   HPI He is in today for evaluation of 7/10 right hand pain going on for 1 month.  He denies any injury.  He denies any repetitive or strenuous activity.  He reports that he is having some numbness that comes and goes which she notices more with twisting of his wrist.  The pain goes up into the forearm.  He denies any previous history of elbow or shoulder injury or pain.  He is currently on amitriptyline.  He reports history of gabapentin use but was discontinued due to side effects. He has a history of hypertension and reports that he is compliant with his medications.  He reports his medication is not working and that is also related to whitecoat syndrome.  Past Medical History:  Diagnosis Date   Hypertension    Kidney stone     There are no problems to display for this patient.   Past Surgical History:  Procedure Laterality Date   BACK SURGERY         Home Medications    Prior to Admission medications   Medication Sig Start Date End Date Taking? Authorizing Provider  amitriptyline (ELAVIL) 25 MG tablet Take 12.5 mg by mouth 2 (two) times daily.    [provider]  amLODipine (NORVASC) 10 MG tablet Take 1 tablet (10 mg total) by mouth daily. 06/25/20   Mardella Layman, MD  diphenhydrAMINE (BENADRYL) 25 mg capsule Take 25 mg by mouth at bedtime.    [provider]  docusate sodium (COLACE) 100 MG capsule Take 1 capsule (100 mg total) by mouth every 12 (twelve) hours. 12/31/15   Azalia Bilis, MD  famotidine (PEPCID) 20 MG tablet Take 1 tablet (20 mg total) by mouth 2 (two) times daily. 12/28/20   Cathlyn Parsons, NP  fluticasone (FLONASE) 50 MCG/ACT nasal spray Place 2 sprays into both nostrils daily. 05/14/21   Rhys Martini, PA-C  mirtazapine (REMERON)  7.5 MG tablet Take 7.5 mg by mouth at bedtime as needed (for pain/sleep).    [provider]  naproxen (NAPROSYN) 500 MG tablet Take 1 tablet (500 mg total) by mouth 2 (two) times daily with a meal. Patient taking differently: Take 500 mg by mouth 2 (two) times daily as needed for mild pain.  06/15/14   Everlene Farrier, PA-C  omeprazole (PRILOSEC) 40 MG capsule Take 40 mg by mouth daily.    [provider]  ondansetron (ZOFRAN ODT) 4 MG disintegrating tablet Take 1 tablet (4 mg total) by mouth every 8 (eight) hours as needed for nausea or vomiting. 06/26/16   Espina, Lucita Lora, PA  triamcinolone (KENALOG) 0.025 % ointment Apply 1 application topically 2 (two) times daily. 12/28/20   Cathlyn Parsons, NP    Family History No family history on file.  Social History Social History   Tobacco Use   Smoking status: Former    Current packs/day: 0.00    Types: Cigarettes    Quit date: 03/31/2002    Years since quitting: 20.5   Smokeless tobacco: Never  Substance Use Topics   Alcohol use: Yes    Comment: Socially    Drug use: Yes    Types: Marijuana     Allergies   Patient  has no known allergies.   Review of Systems Review of Systems   Physical Exam Triage Vital Signs ED Triage Vitals  Encounter Vitals Group     BP 10/14/22 0847 (!) 186/107     Systolic BP Percentile --      Diastolic BP Percentile --      Pulse Rate 10/14/22 0847 (!) 50     Resp 10/14/22 0847 17     Temp 10/14/22 0847 98 F (36.7 C)     Temp Source 10/14/22 0847 Oral     SpO2 10/14/22 0847 97 %     Weight --      Height --      Head Circumference --      Peak Flow --      Pain Score 10/14/22 0846 7     Pain Loc --      Pain Education --      Exclude from Growth Chart --    No data found.  Updated Vital Signs BP (!) 168/102 (BP Location: Left Arm)   Pulse (!) 50   Temp 98 F (36.7 C) (Oral)   Resp 17   SpO2 97%   Visual Acuity Right Eye Distance:   Left Eye Distance:    Bilateral Distance:    Right Eye Near:   Left Eye Near:    Bilateral Near:     Physical Exam Constitutional:      Appearance: Normal appearance.  HENT:     Head: Normocephalic and atraumatic.  Cardiovascular:     Rate and Rhythm: Normal rate.  Pulmonary:     Effort: Pulmonary effort is normal.  Musculoskeletal:     Right wrist: No tenderness, snuff box tenderness or crepitus. Normal range of motion. Normal pulse.     Left wrist: Normal.     Right hand: No swelling or deformity. Normal range of motion. There is no disruption of two-point discrimination. Normal capillary refill. Normal pulse.     Left hand: Normal.  Neurological:     Mental Status: He is alert.      UC Treatments / Results  Labs (all labs ordered are listed, but only abnormal results are displayed) Labs Reviewed - No data to display  EKG   Radiology DG Hand Complete Right  Result Date: 10/14/2022 CLINICAL DATA:  Pain in right thumb EXAM: RIGHT HAND - COMPLETE 3+ VIEW COMPARISON:  None Available. FINDINGS: No recent fracture or dislocation is seen in right hand. There is 1 mm smoothly marginated calcification adjacent to the base of middle phalanx of middle finger, possibly old avulsion or ligament calcification from previous injury. Small bony spurs are seen in interphalangeal joint of right thumb, first metacarpophalangeal joint and first carpometacarpal joint. IMPRESSION: No recent fracture or dislocation is seen. Degenerative changes with minimal bony spurs are noted in first carpometacarpal joint, first metacarpophalangeal joint and interphalangeal joint of right thumb. Electronically Signed   By: Ernie Avena M.D.   On: 10/14/2022 09:54    Procedures Procedures (including critical care time)  Medications Ordered in UC Medications - No data to display  Initial Impression / Assessment and Plan / UC Course  I have reviewed the triage vital signs and the nursing notes.  Pertinent labs & imaging  results that were available during my care of the patient were reviewed by me and considered in my medical decision making (see chart for details).     Right thumb pain Final Clinical Impressions(s) / UC Diagnoses  Final diagnoses:  Pain of right thumb  Spoke with Richard Barrera on the phone and we discussed the bony spurs which my contribute to his pain. He would like to speak with a specialist for additional treatment options.    Discharge Instructions      Overall your x-ray is pending results.  Will call you with the results once radiology is confirmed.  We do recommend that you use over the counter Tylenol as directed (do not exceed daily limits).   As discussed for the Hypertension the recommendation would be for you to continue to monitor blood pressure at home and follow-up with your primary care provider if your blood pressure remains greater than 140/90.  There are additional antihypertensive medications that may be effective to treat your blood pressure.     ED Prescriptions   None    PDMP not reviewed this encounter.   Barbette Merino, NP 10/14/22 1205    Barbette Merino, NP 10/14/22 [phone]

## 2022-10-14 NOTE — ED Triage Notes (Signed)
For month or so right thumb has tingling, pain, and numbness. Denies injury.

## 2023-01-12 ENCOUNTER — Ambulatory Visit (HOSPITAL_COMMUNITY)
Admission: EM | Admit: 2023-01-12 | Discharge: 2023-01-12 | Disposition: A | Discharge status: Home / Self Care | Payer: No Typology Code available for payment source | Attending: Family Medicine | Admitting: Family Medicine

## 2023-01-12 ENCOUNTER — Encounter (HOSPITAL_COMMUNITY): Payer: Self-pay | Admitting: Emergency Medicine

## 2023-01-12 DIAGNOSIS — K529 Noninfective gastroenteritis and colitis, unspecified: Secondary | ICD-10-CM | POA: Insufficient documentation

## 2023-01-12 DIAGNOSIS — R202 Paresthesia of skin: Secondary | ICD-10-CM | POA: Insufficient documentation

## 2023-01-12 DIAGNOSIS — M5010 Cervical disc disorder with radiculopathy, unspecified cervical region: Secondary | ICD-10-CM | POA: Diagnosis not present

## 2023-01-12 DIAGNOSIS — R200 Anesthesia of skin: Secondary | ICD-10-CM | POA: Insufficient documentation

## 2023-01-12 LAB — COMPREHENSIVE METABOLIC PANEL
ALT: 15 U/L (ref 0–44)
AST: 20 U/L (ref 15–41)
Albumin: 4.2 g/dL (ref 3.5–5.0)
Alkaline Phosphatase: 56 U/L (ref 38–126)
Anion gap: 11 (ref 5–15)
BUN: 5 mg/dL — ABNORMAL LOW (ref 6–20)
CO2: 26 mmol/L (ref 22–32)
Calcium: 9.8 mg/dL (ref 8.9–10.3)
Chloride: 103 mmol/L (ref 98–111)
Creatinine, Ser: 1.04 mg/dL (ref 0.61–1.24)
GFR, Estimated: >60 mL/min (ref >60)
Glucose, Bld: 101 mg/dL — ABNORMAL HIGH (ref 70–99)
Potassium: 4 mmol/L (ref 3.5–5.1)
Sodium: 140 mmol/L (ref 135–145)
Total Bilirubin: 0.7 mg/dL (ref 0.3–1.2)
Total Protein: 7.2 g/dL (ref 6.5–8.1)

## 2023-01-12 LAB — CBC
HCT: 51.4 % (ref 39.0–52.0)
Hemoglobin: 17.5 g/dL — ABNORMAL HIGH (ref 13.0–17.0)
MCH: 30 pg (ref 26.0–34.0)
MCHC: 34 g/dL (ref 30.0–36.0)
MCV: 88.2 fL (ref 80.0–100.0)
Platelets: 204 10*3/uL (ref 150–400)
RBC: 5.83 MIL/uL — ABNORMAL HIGH (ref 4.22–5.81)
RDW: 14.2 % (ref 11.5–15.5)
WBC: 4.1 10*3/uL (ref 4.0–10.5)
nRBC: 0 % (ref 0.0–0.2)

## 2023-01-12 LAB — VITAMIN B12: Vitamin B-12: 200 pg/mL (ref 180–914)

## 2023-01-12 LAB — TSH: TSH: 1.773 u[IU]/mL (ref 0.350–4.500)

## 2023-01-12 MED ORDER — ONDANSETRON 4 MG PO TBDP: 4.0000 mg | ORAL_TABLET | Freq: Three times a day (TID) | ORAL | 0 refills | Status: DC | PRN

## 2023-01-12 NOTE — ED Triage Notes (Addendum)
Pt c/o arm and hand numbness and tingling. Pt states he initially felt his whole arm and hand go numb on Thursday follow up an episode nauseous and threw up on Thursday. He is still experiencing hand numbness and tingling and will have episodes of nausea and vomit.  Pt states arm is not painful but just has discomfort

## 2023-01-12 NOTE — ED Provider Notes (Signed)
[facility]    CSN: [id number] Arrival date & time: 01/12/23  0800      History   Chief Complaint No chief complaint on file.   HPI Richard Barrera is a 57 y.o. male.   HPI Here for right arm and hand numbness that started bothering him again 5 days ago. He has had this bother him off and on for the last few months.  Also has had n/v/d for the last 5 days. Has had some sweats, but unaware of fever. Has had loose stools twice, first happened yesterday.  Last bowel movement was this morning.  No blood in the stool or in the emesis.  He has vomited 2 or 3 times through the last few days.  No cough/congestion. No cp or dyspnea.  NKDA  Has h/o htn, bp high today, but usually high when seen here.  Has a h/o migraines, no n/v.   Has had some h/a during this time, none now.  No neck pain.  No upper back pain. Past Medical History:  Diagnosis Date   Hypertension    Kidney stone     There are no problems to display for this patient.   Past Surgical History:  Procedure Laterality Date   BACK SURGERY         Home Medications    Prior to Admission medications   Medication Sig Start Date End Date Taking? Authorizing Provider  ondansetron (ZOFRAN-ODT) 4 MG disintegrating tablet Take 1 tablet (4 mg total) by mouth every 8 (eight) hours as needed for nausea or vomiting. 01/12/23  Yes Zenia Resides, MD  amitriptyline (ELAVIL) 25 MG tablet Take 12.5 mg by mouth 2 (two) times daily.    [provider]  amLODipine (NORVASC) 10 MG tablet Take 1 tablet (10 mg total) by mouth daily. 06/25/20   Mardella Layman, MD  diphenhydrAMINE (BENADRYL) 25 mg capsule Take 25 mg by mouth at bedtime.    [provider]  docusate sodium (COLACE) 100 MG capsule Take 1 capsule (100 mg total) by mouth every 12 (twelve) hours. 12/31/15   Azalia Bilis, MD  famotidine (PEPCID) 20 MG tablet Take 1 tablet (20 mg total) by mouth 2 (two) times daily. 12/28/20   Cathlyn Parsons,  NP  fluticasone (FLONASE) 50 MCG/ACT nasal spray Place 2 sprays into both nostrils daily. 05/14/21   Rhys Martini, PA-C  mirtazapine (REMERON) 7.5 MG tablet Take 7.5 mg by mouth at bedtime as needed (for pain/sleep).    [provider]  naproxen (NAPROSYN) 500 MG tablet Take 1 tablet (500 mg total) by mouth 2 (two) times daily with a meal. Patient taking differently: Take 500 mg by mouth 2 (two) times daily as needed for mild pain.  06/15/14   Everlene Farrier, PA-C  omeprazole (PRILOSEC) 40 MG capsule Take 40 mg by mouth daily.    [provider]  triamcinolone (KENALOG) 0.025 % ointment Apply 1 application topically 2 (two) times daily. Patient not taking: Reported on 01/12/2023 12/28/20   Cathlyn Parsons, NP    Family History History reviewed. No pertinent family history.  Social History Social History   Tobacco Use   Smoking status: Former    Current packs/day: 0.00    Types: Cigarettes    Quit date: 03/31/2002    Years since quitting: 20.8   Smokeless tobacco: Never  Vaping Use   Vaping status: Never Used  Substance Use Topics   Alcohol use: Yes    Comment: Socially  Drug use: Yes    Types: Marijuana     Allergies   Patient has no known allergies.   Review of Systems Review of Systems   Physical Exam Triage Vital Signs ED Triage Vitals  Encounter Vitals Group     BP 01/12/23 0823 (!) 172/107     Systolic BP Percentile --      Diastolic BP Percentile --      Pulse Rate 01/12/23 0823 (!) 56     Resp 01/12/23 0823 16     Temp 01/12/23 0823 (!) 97.4 F (36.3 C)     Temp Source 01/12/23 0823 Oral     SpO2 01/12/23 0823 98 %     Weight --      Height --      Head Circumference --      Peak Flow --      Pain Score 01/12/23 0821 4     Pain Loc --      Pain Education --      Exclude from Growth Chart --    No data found.  Updated Vital Signs BP (!) 172/107 (BP Location: Left Arm)   Pulse (!) 56   Temp (!) 97.4 F (36.3 C) (Oral)    Resp 16   SpO2 98%   Visual Acuity Right Eye Distance:   Left Eye Distance:   Bilateral Distance:    Right Eye Near:   Left Eye Near:    Bilateral Near:     Physical Exam Vitals reviewed.  Constitutional:      General: He is not in acute distress.    Appearance: He is not ill-appearing, toxic-appearing or diaphoretic.  HENT:     Mouth/Throat:     Mouth: Mucous membranes are moist.     Pharynx: No oropharyngeal exudate or posterior oropharyngeal erythema.  Eyes:     Extraocular Movements: Extraocular movements intact.     Conjunctiva/sclera: Conjunctivae normal.     Pupils: Pupils are equal, round, and reactive to light.  Cardiovascular:     Rate and Rhythm: Normal rate and regular rhythm.     Heart sounds: No murmur heard. Pulmonary:     Effort: Pulmonary effort is normal.     Breath sounds: Normal breath sounds.  Abdominal:     General: There is no distension.     Palpations: Abdomen is soft.     Tenderness: There is no abdominal tenderness. There is no guarding.  Musculoskeletal:     Cervical back: Neck supple.  Lymphadenopathy:     Cervical: No cervical adenopathy.  Skin:    Coloration: Skin is not jaundiced or pale.  Neurological:     Mental Status: He is alert and oriented to person, place, and time.     Comments: Motor strength and deep tendon reflexes are normal in the upper extremities.  There is maybe some decreased light touch sensation in the right hand.  Psychiatric:        Behavior: Behavior normal.      UC Treatments / Results  Labs (all labs ordered are listed, but only abnormal results are displayed) Labs Reviewed  CBC  COMPREHENSIVE METABOLIC PANEL  VITAMIN B12  TSH    EKG   Radiology No results found.  Procedures Procedures (including critical care time)  Medications Ordered in UC Medications - No data to display  Initial Impression / Assessment and Plan / UC Course  I have reviewed the triage vital signs and the nursing  notes.  Pertinent  labs & imaging results that were available during my care of the patient were reviewed by me and considered in my medical decision making (see chart for details).       It turns out that the patient has already been to the [place] on October 11th and was prescribed gabapentin, though he is not in any pain.  He also states he has been referred to their spine surgery specialist.  CBC, CMP, TSH, and B12 are drawn today to address the numbness.  We will notify him of anything significantly abnormal.  Zofran is sent in for the nausea.  Reassurance is attempted to let him know that I do not think this is typical of a stroke  However if he feels he needs to be evaluated urgently for the symptoms he can go to the emergency room.,  As we do not order advanced imaging here in the urgent care setting. Final Clinical Impressions(s) / UC Diagnoses   Final diagnoses:  Gastroenteritis  Numbness and tingling in right hand  Cervical disc disorder with radiculopathy of cervical region     Discharge Instructions      Ondansetron dissolved in the mouth every 8 hours as needed for nausea or vomiting. Clear liquids(water, gatorade/pedialyte, ginger ale/sprite, chicken broth/soup) and bland things(crackers/toast, rice, potato, bananas) to eat. Avoid acidic foods like lemon/lime/orange/tomato, and avoid greasy/spicy foods.  We have drawn blood to check your blood counts, electrolytes and kidney and liver function, B12, and thyroid function.  Our staff will call you if anything is significantly abnormal and needs treatment.  [facility]     ED Prescriptions     Medication Sig Dispense Auth. Provider   ondansetron (ZOFRAN-ODT) 4 MG disintegrating tablet Take 1 tablet (4 mg total) by mouth every 8 (eight) hours as needed for nausea or vomiting. 10 tablet Marlinda Mike Janace Aris, MD      PDMP not reviewed this encounter.   Zenia Resides,  MD 01/12/23 [phone]

## 2023-01-12 NOTE — Discharge Instructions (Signed)
Ondansetron dissolved in the mouth every 8 hours as needed for nausea or vomiting. Clear liquids(water, gatorade/pedialyte, ginger ale/sprite, chicken broth/soup) and bland things(crackers/toast, rice, potato, bananas) to eat. Avoid acidic foods like lemon/lime/orange/tomato, and avoid greasy/spicy foods.  We have drawn blood to check your blood counts, electrolytes and kidney and liver function, B12, and thyroid function.  Our staff will call you if anything is significantly abnormal and needs treatment.  [facility].

## 2023-01-17 ENCOUNTER — Emergency Department (HOSPITAL_BASED_OUTPATIENT_CLINIC_OR_DEPARTMENT_OTHER): Payer: No Typology Code available for payment source

## 2023-01-17 ENCOUNTER — Encounter (HOSPITAL_BASED_OUTPATIENT_CLINIC_OR_DEPARTMENT_OTHER): Payer: Self-pay

## 2023-01-17 ENCOUNTER — Inpatient Hospital Stay (HOSPITAL_BASED_OUTPATIENT_CLINIC_OR_DEPARTMENT_OTHER)
Admission: EM | Admit: 2023-01-17 | Discharge: 2023-01-19 | DRG: 065 | Disposition: A | Discharge status: Home / Self Care | Payer: No Typology Code available for payment source | Attending: Internal Medicine | Admitting: Internal Medicine

## 2023-01-17 DIAGNOSIS — R4701 Aphasia: Secondary | ICD-10-CM | POA: Diagnosis present

## 2023-01-17 DIAGNOSIS — K219 Gastro-esophageal reflux disease without esophagitis: Secondary | ICD-10-CM | POA: Diagnosis present

## 2023-01-17 DIAGNOSIS — R29711 NIHSS score 11: Secondary | ICD-10-CM | POA: Diagnosis present

## 2023-01-17 DIAGNOSIS — I6389 Other cerebral infarction: Secondary | ICD-10-CM | POA: Diagnosis not present

## 2023-01-17 DIAGNOSIS — I639 Cerebral infarction, unspecified: Secondary | ICD-10-CM | POA: Diagnosis not present

## 2023-01-17 DIAGNOSIS — F1210 Cannabis abuse, uncomplicated: Secondary | ICD-10-CM | POA: Diagnosis present

## 2023-01-17 DIAGNOSIS — T465X6A Underdosing of other antihypertensive drugs, initial encounter: Secondary | ICD-10-CM | POA: Diagnosis present

## 2023-01-17 DIAGNOSIS — F1910 Other psychoactive substance abuse, uncomplicated: Secondary | ICD-10-CM

## 2023-01-17 DIAGNOSIS — H53461 Homonymous bilateral field defects, right side: Secondary | ICD-10-CM | POA: Diagnosis present

## 2023-01-17 DIAGNOSIS — H1133 Conjunctival hemorrhage, bilateral: Secondary | ICD-10-CM | POA: Diagnosis present

## 2023-01-17 DIAGNOSIS — Z79899 Other long term (current) drug therapy: Secondary | ICD-10-CM

## 2023-01-17 DIAGNOSIS — Z7982 Long term (current) use of aspirin: Secondary | ICD-10-CM | POA: Diagnosis not present

## 2023-01-17 DIAGNOSIS — G9340 Encephalopathy, unspecified: Secondary | ICD-10-CM | POA: Insufficient documentation

## 2023-01-17 DIAGNOSIS — F17210 Nicotine dependence, cigarettes, uncomplicated: Secondary | ICD-10-CM | POA: Diagnosis present

## 2023-01-17 DIAGNOSIS — Z91148 Patient's other noncompliance with medication regimen for other reason: Secondary | ICD-10-CM

## 2023-01-17 DIAGNOSIS — E785 Hyperlipidemia, unspecified: Secondary | ICD-10-CM | POA: Diagnosis present

## 2023-01-17 DIAGNOSIS — R443 Hallucinations, unspecified: Secondary | ICD-10-CM | POA: Diagnosis present

## 2023-01-17 DIAGNOSIS — I63432 Cerebral infarction due to embolism of left posterior cerebral artery: Secondary | ICD-10-CM | POA: Diagnosis present

## 2023-01-17 DIAGNOSIS — I10 Essential (primary) hypertension: Secondary | ICD-10-CM | POA: Diagnosis present

## 2023-01-17 LAB — COMPREHENSIVE METABOLIC PANEL
ALT: 12 U/L (ref 0–44)
AST: 15 U/L (ref 15–41)
Albumin: 4.2 g/dL (ref 3.5–5.0)
Alkaline Phosphatase: 52 U/L (ref 38–126)
Anion gap: 9 (ref 5–15)
BUN: 10 mg/dL (ref 6–20)
CO2: 29 mmol/L (ref 22–32)
Calcium: 9.6 mg/dL (ref 8.9–10.3)
Chloride: 102 mmol/L (ref 98–111)
Creatinine, Ser: 1.09 mg/dL (ref 0.61–1.24)
GFR, Estimated: >60 mL/min (ref >60)
Glucose, Bld: 97 mg/dL (ref 70–99)
Potassium: 3.8 mmol/L (ref 3.5–5.1)
Sodium: 140 mmol/L (ref 135–145)
Total Bilirubin: 0.7 mg/dL (ref 0.3–1.2)
Total Protein: 7.4 g/dL (ref 6.5–8.1)

## 2023-01-17 LAB — T4, FREE: Free T4: 1.22 ng/dL — ABNORMAL HIGH (ref 0.61–1.12)

## 2023-01-17 LAB — CBC WITH DIFFERENTIAL/PLATELET
Abs Immature Granulocytes: 0.02 10*3/uL (ref 0.00–0.07)
Basophils Absolute: 0 10*3/uL (ref 0.0–0.1)
Basophils Relative: 1 %
Eosinophils Absolute: 0.1 10*3/uL (ref 0.0–0.5)
Eosinophils Relative: 2 %
HCT: 52.8 % — ABNORMAL HIGH (ref 39.0–52.0)
Hemoglobin: 17.8 g/dL — ABNORMAL HIGH (ref 13.0–17.0)
Immature Granulocytes: 1 %
Lymphocytes Relative: 29 %
Lymphs Abs: 1.2 10*3/uL (ref 0.7–4.0)
MCH: 29.9 pg (ref 26.0–34.0)
MCHC: 33.7 g/dL (ref 30.0–36.0)
MCV: 88.7 fL (ref 80.0–100.0)
Monocytes Absolute: 0.3 10*3/uL (ref 0.1–1.0)
Monocytes Relative: 7 %
Neutro Abs: 2.6 10*3/uL (ref 1.7–7.7)
Neutrophils Relative %: 60 %
Platelets: 207 10*3/uL (ref 150–400)
RBC: 5.95 MIL/uL — ABNORMAL HIGH (ref 4.22–5.81)
RDW: 14.3 % (ref 11.5–15.5)
WBC: 4.2 10*3/uL (ref 4.0–10.5)
nRBC: 0 % (ref 0.0–0.2)

## 2023-01-17 LAB — ETHANOL: Alcohol, Ethyl (B): <10 mg/dL (ref <10)

## 2023-01-17 LAB — PROTIME-INR
INR: 1 (ref 0.8–1.2)
Prothrombin Time: 13.1 s (ref 11.4–15.2)

## 2023-01-17 LAB — AMMONIA
Ammonia: 302 umol/L — ABNORMAL HIGH (ref 9–35)
Ammonia: 19 umol/L (ref 9–35)

## 2023-01-17 LAB — TSH: TSH: 1.778 u[IU]/mL (ref 0.350–4.500)

## 2023-01-17 LAB — RAPID URINE DRUG SCREEN, HOSP PERFORMED
Amphetamines: NOT DETECTED
Barbiturates: NOT DETECTED
Benzodiazepines: NOT DETECTED
Cocaine: NOT DETECTED
Opiates: NOT DETECTED
Tetrahydrocannabinol: POSITIVE — AB

## 2023-01-17 LAB — TROPONIN I (HIGH SENSITIVITY)
Troponin I (High Sensitivity): 12 ng/L (ref <18)
Troponin I (High Sensitivity): 14 ng/L (ref <18)

## 2023-01-17 LAB — CBG MONITORING, ED: Glucose-Capillary: 97 mg/dL (ref 70–99)

## 2023-01-17 MED ORDER — HYDRALAZINE HCL 20 MG/ML IJ SOLN
5.0000 mg | Freq: Once | INTRAMUSCULAR | Status: AC
Start: 2023-01-17 — End: 2023-01-17
  Administered 2023-01-17: 5 mg via INTRAVENOUS
  Filled 2023-01-17: qty 1

## 2023-01-17 MED ORDER — ASPIRIN 300 MG RE SUPP
300.0000 mg | Freq: Every day | RECTAL | Status: DC
  Filled 2023-01-17: qty 1

## 2023-01-17 MED ORDER — IOHEXOL 350 MG/ML SOLN
75.0000 mL | Freq: Once | INTRAVENOUS | Status: AC | PRN
  Administered 2023-01-17: 75 mL via INTRAVENOUS

## 2023-01-17 MED ORDER — LORAZEPAM 2 MG/ML IJ SOLN
1.0000 mg | Freq: Once | INTRAMUSCULAR | Status: AC
  Administered 2023-01-17: 1 mg via INTRAVENOUS
  Filled 2023-01-17: qty 1

## 2023-01-17 MED ORDER — ASPIRIN 81 MG PO CHEW
81.0000 mg | CHEWABLE_TABLET | Freq: Every day | ORAL | Status: DC
  Administered 2023-01-17 – 2023-01-19 (×3): 81 mg via ORAL
  Filled 2023-01-17 (×3): qty 1

## 2023-01-17 MED ORDER — ONDANSETRON HCL 4 MG/2ML IJ SOLN
4.0000 mg | Freq: Once | INTRAMUSCULAR | Status: AC
  Administered 2023-01-17: 4 mg via INTRAVENOUS
  Filled 2023-01-17: qty 2

## 2023-01-17 MED ORDER — IOHEXOL 350 MG/ML SOLN: 100.0000 mL | Freq: Once | INTRAVENOUS | Status: DC | PRN

## 2023-01-17 NOTE — ED Notes (Signed)
Pt given crackers and water after passing swallow screen.

## 2023-01-17 NOTE — ED Provider Notes (Signed)
[place] EMERGENCY DEPARTMENT AT [facility] Provider Note   CSN: [id number] Arrival date & time: 01/17/23  1020     History  Chief Complaint  Patient presents with   Possible Stroke     Richard Barrera is a 57 y.o. male.  HPI     57 year old male with a history of hypertension not on blood pressure medications, chronic right hydronephrosis, left lumbar radiculopathy, history of migraines, PTSD, who presents with concern for memory problems, speech problems, right hand numbness.  He reports his symptoms have been going on for approximately 2 days, his sister is at bedside and reports that it has been 2 weeks.  Reports that he has seemed to be more confused, forgetful, has been messing up his words, for example he locked himself out the other day and then he called her asking her for his car keys when he meant to say house keys.  She reports he has had numbness to his right arm and hand, has had headaches, intermittent nausea and vomiting.  He denies chest pain, shortness of breath, abdominal pain.  He does not smoke cigarettes, denies drug use, denies regular alcohol use.  He has not been taking his blood pressure medications for a long time.  He is tearful and reports that he is here because something is wrong.  Past Medical History:  Diagnosis Date   Hypertension    Kidney stone      Home Medications Prior to Admission medications   Medication Sig Start Date End Date Taking? Authorizing Provider  amitriptyline (ELAVIL) 25 MG tablet Take 12.5 mg by mouth 2 (two) times daily.    [provider]  amLODipine (NORVASC) 10 MG tablet Take 1 tablet (10 mg total) by mouth daily. 06/25/20   Mardella Layman, MD  diphenhydrAMINE (BENADRYL) 25 mg capsule Take 25 mg by mouth at bedtime.    [provider]  docusate sodium (COLACE) 100 MG capsule Take 1 capsule (100 mg total) by mouth every 12 (twelve) hours. 12/31/15   Azalia Bilis, MD  famotidine (PEPCID) 20  MG tablet Take 1 tablet (20 mg total) by mouth 2 (two) times daily. 12/28/20   Cathlyn Parsons, NP  fluticasone (FLONASE) 50 MCG/ACT nasal spray Place 2 sprays into both nostrils daily. 05/14/21   Rhys Martini, PA-C  mirtazapine (REMERON) 7.5 MG tablet Take 7.5 mg by mouth at bedtime as needed (for pain/sleep).    [provider]  naproxen (NAPROSYN) 500 MG tablet Take 1 tablet (500 mg total) by mouth 2 (two) times daily with a meal. Patient taking differently: Take 500 mg by mouth 2 (two) times daily as needed for mild pain.  06/15/14   Everlene Farrier, PA-C  omeprazole (PRILOSEC) 40 MG capsule Take 40 mg by mouth daily.    [provider]  ondansetron (ZOFRAN-ODT) 4 MG disintegrating tablet Take 1 tablet (4 mg total) by mouth every 8 (eight) hours as needed for nausea or vomiting. 01/12/23   Zenia Resides, MD  triamcinolone (KENALOG) 0.025 % ointment Apply 1 application topically 2 (two) times daily. Patient not taking: Reported on 01/12/2023 12/28/20   Cathlyn Parsons, NP      Allergies    Patient has no known allergies.    Review of Systems   Review of Systems  Physical Exam Updated Vital Signs BP (!) 182/83 (BP Location: Right Arm)   Pulse (!) 50   Temp 98 F (36.7 C) (Oral)   Resp 16  Ht 6\' 2"  (1.88 m)   Wt 83.9 kg   SpO2 96%   BMI 23.75 kg/m  Physical Exam Constitutional:      General: He is not in acute distress.    Appearance: Normal appearance. He is not ill-appearing.  HENT:     Head: Normocephalic and atraumatic.  Eyes:     General: No visual field deficit.    Extraocular Movements: Extraocular movements intact.     Conjunctiva/sclera: Conjunctivae normal.     Pupils: Pupils are equal, round, and reactive to light.  Cardiovascular:     Rate and Rhythm: Normal rate and regular rhythm.     Pulses: Normal pulses.  Pulmonary:     Effort: Pulmonary effort is normal. No respiratory distress.  Abdominal:     General: There is no distension.      Tenderness: There is no abdominal tenderness.  Musculoskeletal:        General: No swelling or tenderness.     Cervical back: Normal range of motion.  Skin:    General: Skin is warm and dry.     Findings: No erythema or rash.  Neurological:     General: No focal deficit present.     Mental Status: He is alert and oriented to person, place, and time.     GCS: GCS eye subscore is 4. GCS verbal subscore is 5. GCS motor subscore is 6.     Cranial Nerves: No cranial nerve deficit, dysarthria or facial asymmetry.     Sensory: No sensory deficit.     Motor: No weakness or tremor.     Coordination: Coordination normal. Finger-Nose-Finger Test normal.     Gait: Gait (deferred) normal.     Comments: He pauses prior to answering questions, but was able to answer his name, location, date, president, and name objects.  He initially paused and seemed frustrated with saying no ifs, ands or buts, however then was able to complete it after trying twice.  He does not have any grip strength weakness, no significant drift but did have some mild pronation on the right arm, no leg weakness, question right visual field deficit with difficulty naming the numbers, normal finger-to-nose, symmetric facies, normal extraocular movements, normal pupils     ED Results / Procedures / Treatments   Labs (all labs ordered are listed, but only abnormal results are displayed) Labs Reviewed  CBC WITH DIFFERENTIAL/PLATELET - Abnormal; Notable for the following components:      Result Value   RBC 5.95 (*)    Hemoglobin 17.8 (*)    HCT 52.8 (*)    All other components within normal limits  RAPID URINE DRUG SCREEN, HOSP PERFORMED - Abnormal; Notable for the following components:   Tetrahydrocannabinol POSITIVE (*)    All other components within normal limits  AMMONIA - Abnormal; Notable for the following components:   Ammonia 302 (*)    All other components within normal limits  T4, FREE - Abnormal; Notable for the  following components:   Free T4 1.22 (*)    All other components within normal limits  COMPREHENSIVE METABOLIC PANEL  ETHANOL  TSH  PROTIME-INR  AMMONIA  CBG MONITORING, ED  TROPONIN I (HIGH SENSITIVITY)  TROPONIN I (HIGH SENSITIVITY)    EKG EKG Interpretation Date/Time:  Saturday January 17 2023 10:36:35 EDT Ventricular Rate:  63 PR Interval:  146 QRS Duration:  103 QT Interval:  445 QTC Calculation: 456 R Axis:   70  Text Interpretation: Sinus rhythm Biatrial  enlargement LVH with secondary repolarization abnormality ST depr, consider ischemia, inferior leads ST depressions inferiorly new, mild elevation aVL, smilar appearance anterior and lateral leads to previous Confirmed by Alvira Monday ([id number]) on 01/17/2023 11:36:57 AM  Radiology CT ANGIO HEAD NECK W WO CM  Result Date: 01/17/2023 CLINICAL DATA:  2-3 weeks of memory impairment and slurred speech with word-finding difficulty. Numbness to the right arm. EXAM: CT ANGIOGRAPHY HEAD AND NECK WITH AND WITHOUT CONTRAST TECHNIQUE: Multidetector CT imaging of the head and neck was performed using the standard protocol during bolus administration of intravenous contrast. Multiplanar CT image reconstructions and MIPs were obtained to evaluate the vascular anatomy. Carotid stenosis measurements (when applicable) are obtained utilizing NASCET criteria, using the distal internal carotid diameter as the denominator. RADIATION DOSE REDUCTION: This exam was performed according to the departmental dose-optimization program which includes automated exposure control, adjustment of the mA and/or kV according to patient size and/or use of iterative reconstruction technique. CONTRAST:  75mL OMNIPAQUE IOHEXOL 350 MG/ML SOLN COMPARISON:  None Available. FINDINGS: CT HEAD FINDINGS Brain: Suspicion for small posterior left frontal cortex infarct as marked on series 5. No hemorrhage or hydrocephalus. No mass or collection detected. Vascular: See below  Skull: Normal Sinuses/Orbits: Normal Review of the MIP images confirms the above findings CTA NECK FINDINGS Aortic arch: Unremarkable.  Three vessel branching. Right carotid system: Vessels are smoothly contoured and widely patent. Minimal atheromatous plaque at the bulb. Left carotid system: Mild atheromatous plaque at the ICA bulb/bifurcation. No stenosis, beading, or ulceration. Vertebral arteries: No proximal subclavian stenosis. The codominant vertebral arteries are smoothly contoured and diffusely patent. Skeleton: Unremarkable Other neck: Unremarkable Upper chest: Negative Review of the MIP images confirms the above findings CTA HEAD FINDINGS Anterior circulation: High-grade left M2 segment stenosis. No major branch occlusion, generalized beading, or aneurysm. Posterior circulation: The vertebral and basilar arteries are smoothly contoured and diffusely patent. No branch occlusion, beading, or aneurysm. Venous sinuses: Unremarkable for arterial timing Anatomic variants: None significant Review of the MIP images confirms the above findings IMPRESSION: Head CT: Suspect small recent infarct in the left posterior frontal cortex. CTA: Focal severe left M2 stenosis. This could be atheromatous plaque but atheromatous disease elsewhere is mild. Electronically Signed   By: Tiburcio Pea M.D.   On: 01/17/2023 12:30    Procedures Procedures    Medications Ordered in ED Medications  iohexol (OMNIPAQUE) 350 MG/ML injection 100 mL (has no administration in time range)  aspirin chewable tablet 81 mg (81 mg Oral Given 01/17/23 2120)    Or  aspirin suppository 300 mg ( Rectal See Alternative 01/17/23 2120)  iohexol (OMNIPAQUE) 350 MG/ML injection 75 mL (75 mLs Intravenous Contrast Given 01/17/23 1150)  hydrALAZINE (APRESOLINE) injection 5 mg (5 mg Intravenous Given 01/17/23 1326)  ondansetron (ZOFRAN) injection 4 mg (4 mg Intravenous Given 01/17/23 1627)  LORazepam (ATIVAN) injection 1 mg (1 mg Intravenous  Given 01/17/23 1732)    ED Course/ Medical Decision Making/ A&P                                   57 year old male with a history of hypertension not on blood pressure medications, chronic right hydronephrosis, left lumbar radiculopathy, history of migraines, PTSD, who presents with concern for memory problems, speech problems, right hand numbness.  Symptoms have been going on for approximately 2 weeks and progressing over the past few days.  Differential diagnosis includes CVA,  ICH, hypertensive encephalopathy, electrolyte abnormalities.  EKG was completed and personally about interpreted by me shows diffuse ST changes, with some similarities to prior, new depressions inferiorly.  He denies any chest pain, shortness of breath, arm pain, or symptoms to suggest acute ACS at this time.  Did order troponin in setting of EKG abnormalities and hypertension  CTA ordered to evaluate for signs of large vessel occlusion, intracranial hemorrhage or aneurysm.   Labs completed and personally read interpreted by me show normal troponin, no evidence of ACS, initial ammonia lab abnormality with repeat normal, no leukocytosis, hgb 17.8, no significant electrolyte abnormalities. TSH WNL. INR WNL.  CTA shows small recent infarct in left posterior frontal cortex which feel does correlate with symptoms, also with focal severe left M2 stenosis.  Discussed with Dr. Otelia Limes. Will admit for further care, MRI can be obtained as inpatient.           Final Clinical Impression(s) / ED Diagnoses Final diagnoses:  Cerebrovascular accident (CVA), unspecified mechanism (HCC)    Rx / DC Orders ED Discharge Orders     None         Alvira Monday, MD 01/18/23 0000

## 2023-01-17 NOTE — ED Notes (Signed)
Provider made aware of pt's decrease in coherency, increase in agitation and restlessness. Pt displaying new, odd behaviors such as grasping at open air and waving urinal around. Pt having increased difficulty understanding and following commands. EDP notified and re-evaluated pt.

## 2023-01-17 NOTE — ED Provider Notes (Signed)
  Physical Exam  BP (!) 149/80   Pulse 72   Temp (!) 97.5 F (36.4 C) (Oral)   Resp 17   Ht 6\' 2"  (1.88 m)   Wt 83.9 kg   SpO2 100%   BMI 23.75 kg/m   Physical Exam  Procedures  Procedures  ED Course / MDM    Medical Decision Making Amount and/or Complexity of Data Reviewed Labs: ordered. Radiology: ordered.  Risk Prescription drug management. Decision regarding hospitalization.   Patient's become more confused.  Has had a decrease in his blood pressure after some medication.  Has had some hallucinations.  Will give some Ativan to help calm patient down.  Still pending admission to hospital.  I reviewed blood work.       Benjiman Core, MD 01/17/23 1729

## 2023-01-17 NOTE — ED Notes (Signed)
Repeat Ammonia being drawn per EDP request

## 2023-01-17 NOTE — ED Notes (Signed)
EDP at bedside to assess.   

## 2023-01-17 NOTE — ED Notes (Signed)
Attempted to contact pt's sister per request for notification of transfer for admission. Number given did not go through.

## 2023-01-17 NOTE — ED Notes (Signed)
EDP provider notified of pt status

## 2023-01-17 NOTE — ED Triage Notes (Signed)
Onset 2 - 3 weeks or memory deficits, slurred speech and word finding difficulties  Numbness to right arm.  States been to [facility] for evaluation and has CT scheduled.  Sister states called him today and thought he was getting worse.

## 2023-01-17 NOTE — ED Notes (Signed)
Pt had x1 episode of emesis. Pt growing increasingly agitated and restless. EDP aware and orders for Zofran placed. Pt family bedside.

## 2023-01-17 NOTE — ED Notes (Signed)
Jamie at [facility] will send transport for Bed Ready 3W RM#08.-ABB(NS)

## 2023-01-17 NOTE — Plan of Care (Signed)
Patient presented to [facility] with confusion. He was found to have right sided deficits and elevated ammonia. No known history of cirrhosis and is alert and oriented. Transfer to [facility] for MR brain due to concern for CVA/ undifferentiated encephalopathy.

## 2023-01-18 ENCOUNTER — Inpatient Hospital Stay (HOSPITAL_COMMUNITY): Payer: No Typology Code available for payment source

## 2023-01-18 ENCOUNTER — Encounter (HOSPITAL_COMMUNITY): Payer: Self-pay | Admitting: Internal Medicine

## 2023-01-18 DIAGNOSIS — F1910 Other psychoactive substance abuse, uncomplicated: Secondary | ICD-10-CM | POA: Diagnosis not present

## 2023-01-18 DIAGNOSIS — I6389 Other cerebral infarction: Secondary | ICD-10-CM | POA: Diagnosis not present

## 2023-01-18 DIAGNOSIS — I639 Cerebral infarction, unspecified: Secondary | ICD-10-CM

## 2023-01-18 DIAGNOSIS — I10 Essential (primary) hypertension: Secondary | ICD-10-CM | POA: Diagnosis not present

## 2023-01-18 LAB — LIPID PANEL
Cholesterol: 179 mg/dL (ref 0–200)
HDL: 36 mg/dL — ABNORMAL LOW (ref >40)
LDL Cholesterol: 125 mg/dL — ABNORMAL HIGH (ref 0–99)
Total CHOL/HDL Ratio: 5 {ratio}
Triglycerides: 91 mg/dL (ref <150)
VLDL: 18 mg/dL (ref 0–40)

## 2023-01-18 LAB — ECHOCARDIOGRAM COMPLETE BUBBLE STUDY
AR max vel: 3.27 cm2
AV Mean grad: 3 mm[Hg]
AV Peak grad: 5.9 mm[Hg]
Ao pk vel: 1.21 m/s
Area-P 1/2: 1.67 cm2
S' Lateral: 2.7 cm

## 2023-01-18 LAB — HIV ANTIBODY (ROUTINE TESTING W REFLEX): HIV Screen 4th Generation wRfx: NONREACTIVE

## 2023-01-18 LAB — HEMOGLOBIN A1C
Hgb A1c MFr Bld: 5.3 % (ref 4.8–5.6)
Mean Plasma Glucose: 105.41 mg/dL

## 2023-01-18 MED ORDER — ACETAMINOPHEN 325 MG PO TABS: 650.0000 mg | ORAL_TABLET | ORAL | Status: DC | PRN

## 2023-01-18 MED ORDER — CLOPIDOGREL BISULFATE 75 MG PO TABS
75.0000 mg | ORAL_TABLET | Freq: Every day | ORAL | Status: DC
  Administered 2023-01-18 – 2023-01-19 (×2): 75 mg via ORAL
  Filled 2023-01-18 (×2): qty 1

## 2023-01-18 MED ORDER — ACETAMINOPHEN 160 MG/5ML PO SOLN: 650.0000 mg | ORAL | Status: DC | PRN

## 2023-01-18 MED ORDER — HYDRALAZINE HCL 20 MG/ML IJ SOLN: 5.0000 mg | Freq: Four times a day (QID) | INTRAMUSCULAR | Status: DC | PRN

## 2023-01-18 MED ORDER — ENOXAPARIN SODIUM 40 MG/0.4ML IJ SOSY
40.0000 mg | PREFILLED_SYRINGE | INTRAMUSCULAR | Status: DC
  Administered 2023-01-18 – 2023-01-19 (×2): 40 mg via SUBCUTANEOUS
  Filled 2023-01-18 (×2): qty 0.4

## 2023-01-18 MED ORDER — STROKE: EARLY STAGES OF RECOVERY BOOK
Freq: Once | Status: AC
  Filled 2023-01-18: qty 1

## 2023-01-18 MED ORDER — LABETALOL HCL 5 MG/ML IV SOLN
10.0000 mg | INTRAVENOUS | Status: DC | PRN
  Administered 2023-01-18 – 2023-01-19 (×2): 10 mg via INTRAVENOUS
  Filled 2023-01-18 (×2): qty 4

## 2023-01-18 MED ORDER — ACETAMINOPHEN 650 MG RE SUPP: 650.0000 mg | RECTAL | Status: DC | PRN

## 2023-01-18 MED ORDER — ROSUVASTATIN CALCIUM 20 MG PO TABS
40.0000 mg | ORAL_TABLET | Freq: Every day | ORAL | Status: DC
  Administered 2023-01-18 – 2023-01-19 (×2): 40 mg via ORAL
  Filled 2023-01-18 (×2): qty 2

## 2023-01-18 MED ORDER — LORAZEPAM 2 MG/ML IJ SOLN
1.0000 mg | Freq: Once | INTRAMUSCULAR | Status: AC
  Administered 2023-01-18: 1 mg via INTRAVENOUS
  Filled 2023-01-18: qty 1

## 2023-01-18 MED ORDER — SENNOSIDES-DOCUSATE SODIUM 8.6-50 MG PO TABS: 1.0000 | ORAL_TABLET | Freq: Every evening | ORAL | Status: DC | PRN

## 2023-01-18 NOTE — Plan of Care (Signed)
  Problem: Education: Goal: Knowledge of General Education information will improve Description Including pain rating scale, medication(s)/side effects and non-pharmacologic comfort measures Outcome: Progressing   

## 2023-01-18 NOTE — Assessment & Plan Note (Addendum)
-  Presented with concerns of right UE numbness with significant expressive aphasia, dysmetria on exam - MRI brain pending - CTA head and neck with focal severe left M2 stenosis - obtain echocardiogram  - daily aspirin started by neurology -Obtain A1c and lipids -PT/OT/SLT -Frequent neuro checks and keep on telemetry -Allow for permissive hypertension with blood pressure treatment as needed only if systolic goes above 220

## 2023-01-18 NOTE — H&P (Signed)
History and Physical    Patient: Richard Barrera FAO:[id number] DOB: 06-27-1965 DOA: 01/17/2023 DOS: the patient was seen and examined on 01/18/2023 PCP: [facility]  Patient coming from:  [facility]  Chief Complaint:  Chief Complaint  Patient presents with   Possible Stroke    HPI: Richard Barrera is a 57 y.o. male with medical history significant of hypertension not on antihypertensives, history of polysubstance abuse, GERD who presents with right hand numbness and confusion.  History obtained from documentation as pt unable to provide history with expressive aphasia. Per sister, he has had confusion, memory loss, speech word confusion, and right hand numbness.   On arrival to ED he was afebrile heart rate in the 40s to 50s, BP of 208/115  CBC without leukocytosis, hemoglobin 17.8. CMP is unremarkable  Troponin of 12 and 14.  UDS is positive for THC.  CT head suspicious for small posterior left frontal cortex infarct. CTA head and neck with focal severe left M2 stenosis.  Findings was discussed with neurology Dr. Otelia Limes who recommended admission for stroke workup.    Patient was also noted in the ED to have increasing agitation and hallucinations.  Patient was then transferred here to [facility] for hospital admission.  On evaluation, pt asleep and upon waking appeared confused and disoriented. Had significant expressive aphasia. Unable to provide hx. Could not tell him his name. Unable to name simple objects or repeat sentences. Has dysmetria with past finger pointing. He became increasingly frustrated and was tearful when he continued to have difficulty word finding.  Denies alcohol, tobacco or illicit drug use.   Review of Systems: As mentioned in the history of present illness. All other systems reviewed and are negative. Past Medical History:  Diagnosis Date   Hypertension    Kidney stone    Past Surgical History:  Procedure Laterality Date   BACK  SURGERY     Social History:  reports that he quit smoking about 20 years ago. His smoking use included cigarettes. He has never used smokeless tobacco. He reports current alcohol use. He reports current drug use. Drug: Marijuana.  No Known Allergies  History reviewed. No pertinent family history.  Prior to Admission medications   Medication Sig Start Date End Date Taking? Authorizing Provider  amitriptyline (ELAVIL) 25 MG tablet Take 12.5 mg by mouth 2 (two) times daily.    [provider]  amLODipine (NORVASC) 10 MG tablet Take 1 tablet (10 mg total) by mouth daily. 06/25/20   Mardella Layman, MD  diphenhydrAMINE (BENADRYL) 25 mg capsule Take 25 mg by mouth at bedtime.    [provider]  docusate sodium (COLACE) 100 MG capsule Take 1 capsule (100 mg total) by mouth every 12 (twelve) hours. 12/31/15   Azalia Bilis, MD  famotidine (PEPCID) 20 MG tablet Take 1 tablet (20 mg total) by mouth 2 (two) times daily. 12/28/20   Cathlyn Parsons, NP  fluticasone (FLONASE) 50 MCG/ACT nasal spray Place 2 sprays into both nostrils daily. 05/14/21   Rhys Martini, PA-C  mirtazapine (REMERON) 7.5 MG tablet Take 7.5 mg by mouth at bedtime as needed (for pain/sleep).    [provider]  naproxen (NAPROSYN) 500 MG tablet Take 1 tablet (500 mg total) by mouth 2 (two) times daily with a meal. Patient taking differently: Take 500 mg by mouth 2 (two) times daily as needed for mild pain.  06/15/14   Everlene Farrier, PA-C  omeprazole (PRILOSEC) 40 MG capsule Take 40 mg  by mouth daily.    [provider]  ondansetron (ZOFRAN-ODT) 4 MG disintegrating tablet Take 1 tablet (4 mg total) by mouth every 8 (eight) hours as needed for nausea or vomiting. 01/12/23   Zenia Resides, MD  triamcinolone (KENALOG) 0.025 % ointment Apply 1 application topically 2 (two) times daily. Patient not taking: Reported on 01/12/2023 12/28/20   Cathlyn Parsons, NP    Physical Exam: Vitals:   01/17/23  1900 01/17/23 1930 01/17/23 2038 01/17/23 2350  BP: (!) 157/95 (!) 178/92 (!) 169/104 (!) 182/83  Pulse:  (!) 57 63 (!) 50  Resp: 10 14 16 16   Temp:   98.4 F (36.9 C) 98 F (36.7 C)  TempSrc:   Oral Oral  SpO2:  100% 100% 96%  Weight:      Height:       Constitutional: NAD, calm, comfortable, middle-age male laying in bed asleep.  Awoke easily to light touch.  Appeared confused and disoriented upon waking. Eyes: lids and conjunctivae normal ENMT: Mucous membranes are moist.  Neck: normal, supple Respiratory: clear to auscultation bilaterally, no wheezing, no crackles. Normal respiratory effort. No accessory muscle use.  Cardiovascular: Regular rate and rhythm, no murmurs / rubs / gallops. No extremity edema.  Abdomen: no tenderness, soft  musculoskeletal: no clubbing / cyanosis. No joint deformity upper and lower extremities.  Normal muscle tone.  Skin: no rashes, lesions, ulcers. No induration Neurologic: CN 2-12 grossly intact.  No facial asymmetry, equal bilateral shoulder shrug, no pronator drift, 5 out of 5 strength of all 4 extremities.  Expressive aphasia.  Unable to name simple objects or repeat sentence.  Only able to answer yes or no to questions.  Able to follow commands.  Dysmetria with past finger pointing. Psychiatric: Alert and oriented x 3.  Frustrated and tearful mood. Data Reviewed:  See HPI  Assessment and Plan: * CVA (cerebral vascular accident) (HCC) - MRI brain pending - CTA head and neck with focal severe left M2 stenosis - obtain echocardiogram  - daily aspirin started by neurology -Obtain A1c and lipids -PT/OT/SLT -Frequent neuro checks and keep on telemetry -Allow for permissive hypertension with blood pressure treatment as needed only if systolic goes above 220  Polysubstance abuse (HCC) -UDS positive for THC  HTN (hypertension) -uncontrolled HTN not on antihypertensives -PRN IV hydralazine for BP >220      Advance Care Planning:   Code  Status: Full Code   Consults: NEUROLOGY  Family Communication: NONE at bedside  Severity of Illness: The appropriate patient status for this patient is INPATIENT. Inpatient status is judged to be reasonable and necessary in order to provide the required intensity of service to ensure the patient's safety. The patient's presenting symptoms, physical exam findings, and initial radiographic and laboratory data in the context of their chronic comorbidities is felt to place them at high risk for further clinical deterioration. Furthermore, it is not anticipated that the patient will be medically stable for discharge from the hospital within 2 midnights of admission.   * I certify that at the point of admission it is my clinical judgment that the patient will require inpatient hospital care spanning beyond 2 midnights from the point of admission due to high intensity of service, high risk for further deterioration and high frequency of surveillance required.*  Author: Anselm Jungling, DO 01/18/2023 1:31 AM  For on call review [email or web address].

## 2023-01-18 NOTE — Progress Notes (Signed)
Echocardiogram 2D Echocardiogram has been performed.  Anahy Esh N Saburo Luger,RDCS 01/18/2023, 1:18 PM

## 2023-01-18 NOTE — Plan of Care (Signed)
  Problem: Education: Goal: Knowledge of General Education information will improve Description: Including pain rating scale, medication(s)/side effects and non-pharmacologic comfort measures Outcome: Progressing   Problem: Health Behavior/Discharge Planning: Goal: Ability to manage health-related needs will improve Outcome: Progressing   Problem: Clinical Measurements: Goal: Ability to maintain clinical measurements within normal limits will improve Outcome: Progressing Goal: Will remain free from infection Outcome: Progressing Goal: Diagnostic test results will improve Outcome: Progressing Goal: Respiratory complications will improve Outcome: Progressing Goal: Cardiovascular complication will be avoided Outcome: Progressing   Problem: Activity: Goal: Risk for activity intolerance will decrease Outcome: Progressing   Problem: Nutrition: Goal: Adequate nutrition will be maintained Outcome: Progressing   Problem: Coping: Goal: Level of anxiety will decrease Outcome: Progressing   Problem: Elimination: Goal: Will not experience complications related to bowel motility Outcome: Progressing Goal: Will not experience complications related to urinary retention Outcome: Progressing   Problem: Pain Managment: Goal: General experience of comfort will improve Outcome: Progressing   Problem: Safety: Goal: Ability to remain free from injury will improve Outcome: Progressing   Problem: Skin Integrity: Goal: Risk for impaired skin integrity will decrease Outcome: Progressing   Problem: Education: Goal: Knowledge of General Education information will improve Description: Including pain rating scale, medication(s)/side effects and non-pharmacologic comfort measures Outcome: Progressing   Problem: Health Behavior/Discharge Planning: Goal: Ability to manage health-related needs will improve Outcome: Progressing   Problem: Clinical Measurements: Goal: Ability to maintain  clinical measurements within normal limits will improve Outcome: Progressing Goal: Will remain free from infection Outcome: Progressing Goal: Diagnostic test results will improve Outcome: Progressing Goal: Respiratory complications will improve Outcome: Progressing Goal: Cardiovascular complication will be avoided Outcome: Progressing   Problem: Activity: Goal: Risk for activity intolerance will decrease Outcome: Progressing   Problem: Nutrition: Goal: Adequate nutrition will be maintained Outcome: Progressing   Problem: Coping: Goal: Level of anxiety will decrease Outcome: Progressing   Problem: Elimination: Goal: Will not experience complications related to bowel motility Outcome: Progressing Goal: Will not experience complications related to urinary retention Outcome: Progressing   Problem: Pain Managment: Goal: General experience of comfort will improve Outcome: Progressing   Problem: Safety: Goal: Ability to remain free from injury will improve Outcome: Progressing   Problem: Skin Integrity: Goal: Risk for impaired skin integrity will decrease Outcome: Progressing   Problem: Education: Goal: Knowledge of disease or condition will improve Outcome: Progressing Goal: Knowledge of secondary prevention will improve (MUST DOCUMENT ALL) Outcome: Progressing Goal: Knowledge of patient specific risk factors will improve Loraine Leriche N/A or DELETE if not current risk factor) Outcome: Progressing   Problem: Ischemic Stroke/TIA Tissue Perfusion: Goal: Complications of ischemic stroke/TIA will be minimized Outcome: Progressing   Problem: Coping: Goal: Will verbalize positive feelings about self Outcome: Progressing Goal: Will identify appropriate support needs Outcome: Progressing   Problem: Health Behavior/Discharge Planning: Goal: Ability to manage health-related needs will improve Outcome: Progressing Goal: Goals will be collaboratively established with  patient/family Outcome: Progressing   Problem: Self-Care: Goal: Ability to participate in self-care as condition permits will improve Outcome: Progressing Goal: Verbalization of feelings and concerns over difficulty with self-care will improve Outcome: Progressing Goal: Ability to communicate needs accurately will improve Outcome: Progressing   Problem: Nutrition: Goal: Risk of aspiration will decrease Outcome: Progressing Goal: Dietary intake will improve Outcome: Progressing

## 2023-01-18 NOTE — Plan of Care (Signed)

## 2023-01-18 NOTE — Consult Note (Addendum)
NEUROLOGY CONSULT NOTE   Date of service: January 18, 2023 Patient Name: Richard Barrera MRN:  [id number] DOB:  Feb 13, 1966 Chief Complaint: "aphasia" Requesting Provider: Anselm Jungling, DO  History of Present Illness  Richard Barrera is a 57 y.o. male with substance use hx, HTN, GERD who presents with aphasia.  No family at bedside and pt unable to provide any meaningful hx. Hx obtained from chart review as I was unable to get in touch with family over phone.  Per chart review, has been confused, R hand numbness and expressive aphasia for 2-3 weeks.  CT Head here concerning for a small L frontal lobe infarct.   LKW: 2-3 weeks ago Modified rankin score: unclear. IV Thrombolysis: not offered, outside window EVT: not offered, outside window   NIHSS components Score: Comment  1a Level of Conscious 0[x]  1[]  2[]  3[]      1b LOC Questions 0[]  1[]  2[x]       1c LOC Commands 0[]  1[]  2[x]       2 Best Gaze 0[x]  1[]  2[]       3 Visual 0[x]  1[]  2[]  3[]      4 Facial Palsy 0[x]  1[]  2[]  3[]      5a Motor Arm - left 0[]  1[x]  2[]  3[]  4[]  UN[]    5b Motor Arm - Right 0[]  1[x]  2[]  3[]  4[]  UN[]    6a Motor Leg - Left 0[]  1[x]  2[]  3[]  4[]  UN[]    6b Motor Leg - Right 0[]  1[x]  2[]  3[]  4[]  UN[]    7 Limb Ataxia 0[x]  1[]  2[]  3[]  UN[]     8 Sensory 0[x]  1[]  2[]  UN[]      9 Best Language 0[]  1[]  2[]  3[x]      10 Dysarthria 0[x]  1[]  2[]  UN[]      11 Extinct. and Inattention 0[x]  1[]  2[]       TOTAL: 11     ROS  Unable to ascertain due to aphasia.  Past History   Past Medical History:  Diagnosis Date   Hypertension    Kidney stone     Past Surgical History:  Procedure Laterality Date   BACK SURGERY      Family History: History reviewed. No pertinent family history.  Social History  reports that he quit smoking about 20 years ago. His smoking use included cigarettes. He has never used smokeless tobacco. He reports current alcohol use. He reports current drug use. Drug: Marijuana.  No Known  Allergies  Medications   Current Facility-Administered Medications:    [START ON 01/19/2023]  stroke: early stages of recovery book, , Does not apply, Once, Tu, Ching T, DO   acetaminophen (TYLENOL) tablet 650 mg, 650 mg, Oral, Q4H PRN **OR** acetaminophen (TYLENOL) 160 MG/5ML solution 650 mg, 650 mg, Per Tube, Q4H PRN **OR** acetaminophen (TYLENOL) suppository 650 mg, 650 mg, Rectal, Q4H PRN, Tu, Ching T, DO   aspirin chewable tablet 81 mg, 81 mg, Oral, Daily, 81 mg at 01/17/23 2120 **OR** aspirin suppository 300 mg, 300 mg, Rectal, Daily, Erick Blinks, MD   enoxaparin (LOVENOX) injection 40 mg, 40 mg, Subcutaneous, Q24H, Tu, Ching T, DO, 40 mg at 01/18/23 0211   hydrALAZINE (APRESOLINE) injection 5 mg, 5 mg, Intravenous, Q6H PRN, Tu, Ching T, DO   iohexol (OMNIPAQUE) 350 MG/ML injection 100 mL, 100 mL, Intravenous, Once PRN, Alvira Monday, MD   senna-docusate (Senokot-S) tablet 1 tablet, 1 tablet, Oral, QHS PRN, Tu, Ching T, DO  Vitals   Vitals:   01/17/23 1900 01/17/23 1930 01/17/23 2038 01/17/23 2350  BP: (!) 157/95 Marland Kitchen)  178/92 (!) 169/104 (!) 182/83  Pulse:  (!) 57 63 (!) 50  Resp: 10 14 16 16   Temp:   98.4 F (36.9 C) 98 F (36.7 C)  TempSrc:   Oral Oral  SpO2:  100% 100% 96%  Weight:      Height:        Body mass index is 23.75 kg/m.  Physical Exam   Constitutional: Appears well-developed and well-nourished.  Psych: Affect appropriate to situation.  Eyes: No scleral injection.  HENT: No OP obstruction.  Head: Normocephalic.  Cardiovascular: Normal rate and regular rhythm.  Respiratory: Effort normal, non-labored breathing.  GI: Soft.  No distension. There is no tenderness.  Skin: WDI.    Neurologic Examination  Mental status/Cognition: Alert, unable to answer any orientation questions. Speech/language: random words or short phrases, non fluent, unable to comprehend most commands. Unable to repeat. Cranial nerves:   CN II Pupils equal and reactive to  light, blinks to threat BL   CN III,IV,VI EOM intact, no gaze preference or deviation, no nystagmus   CN V normal sensation in V1, V2, and V3 segments bilaterally   CN VII no asymmetry, no nasolabial fold flattening    CN VIII normal hearing to speech    CN IX & X normal palatal elevation, no uvular deviation    CN XI 5/5 head turn and 5/5 shoulder shrug bilaterally    CN XII midline tongue protrusion    Motor:  Muscle bulk: normal, tone normal, pronator drift none tremor none Mvmt Root Nerve  Muscle Right Left Comments  SA C5/6 Ax Deltoid     EF C5/6 Mc Biceps     EE C6/7/8 Rad Triceps     WF C6/7 Med FCR     WE C7/8 PIN ECU     F Ab C8/T1 U ADM/FDI 5 5   HF L1/2/3 Fem Illopsoas 5 5   KE L2/3/4 Fem Quad     DF L4/5 D Peron Tib Ant     PF S1/2 Tibial Grc/Sol      Sensation:  Light touch Regards touch in all extremities.   Pin prick    Temperature    Vibration   Proprioception    Coordination/Complex Motor:  Unable to assess 2/2 global aphasia.  Labs   CBC:  Recent Labs  Lab 01/12/23 0857 01/17/23 1123  WBC 4.1 4.2  NEUTROABS  --  2.6  HGB 17.5* 17.8*  HCT 51.4 52.8*  MCV 88.2 88.7  PLT 204 207    Basic Metabolic Panel:  Lab Results  Component Value Date   NA 140 01/17/2023   K 3.8 01/17/2023   CO2 29 01/17/2023   GLUCOSE 97 01/17/2023   BUN 10 01/17/2023   CREATININE 1.09 01/17/2023   CALCIUM 9.6 01/17/2023   GFRNONAA >60 01/17/2023   GFRAA >60 06/26/2016   Lipid Panel: No results found for: "LDLCALC" HgbA1c: No results found for: "HGBA1C" Urine Drug Screen:     Component Value Date/Time   LABOPIA NONE DETECTED 01/17/2023 1123   COCAINSCRNUR NONE DETECTED 01/17/2023 1123   LABBENZ NONE DETECTED 01/17/2023 1123   AMPHETMU NONE DETECTED 01/17/2023 1123   THCU POSITIVE (A) 01/17/2023 1123   LABBARB NONE DETECTED 01/17/2023 1123    Alcohol Level     Component Value Date/Time   ETH <10 01/17/2023 1123   INR  Lab Results  Component Value Date    INR 1.0 01/17/2023   APTT No results found for: "APTT" AED levels: No  results found for: "PHENYTOIN", "ZONISAMIDE", "LAMOTRIGINE", "LEVETIRACETA"   CT Head without contrast(Personally reviewed): Concerning for recent small left posterior frontal cortical infarct.  CT angio Head and Neck with contrast(Personally reviewed): With focal severe L MCA M2 stenosis.  MRI Brain: pending  Impression   Nameer Hagle is a 57 y.o. male with 2-3 weeks of aphasia and concern for R hand numbness and found to have small recent L posterior frontal infarct along with L MCA M2 severe stenosis.  Recommendations  - Frequent Neuro checks per stroke unit protocol - Recommend brain imaging with MRI Brain without contrast - Recommend obtaining TTE - Recommend obtaining Lipid panel with LDL - Please start statin if LDL > 70 - Recommend HbA1c to evaluate for diabetes and how well it is controlled. - Antithrombotic - Aspirin 81mg  daily along with plavix 75mg  daily for 3 months, then Aspirin 81mg  daily alone. - Recommend DVT ppx - SBP goal - aim for gradual normotension. - Recommend Telemetry monitoring for arrythmia - Recommend bedside swallow screen prior to PO intake. - Stroke education booklet - Recommend PT/OT/SLP consult - Recommend Urine Tox screen.  ______________________________________________________________________    Welton Flakes

## 2023-01-18 NOTE — Evaluation (Signed)
Physical Therapy Evaluation Patient Details Name: Richard Barrera MRN: [id number] DOB: 08-19-1965 Today's Date: 01/18/2023  History of Present Illness  57 y.o. male who presents to ED 10/19 with right hand numbness, confusion, memory loss, aphasia, CT Head here concerning for a small L frontal lobe infarct. Unable to finish MRI due to claustrophobia. PMH: hypertension not on antihypertensives, history of polysubstance abuse, GERD  Clinical Impression  PTA pt reports he was independent with mobility and ADLs, iADLs. Pt becomes emotional when he can not answer questions as to home set up. Does better with conversation that he initiates. He is able to relate he lives in a single story home. At the end of the session he does state he might be able to go to his sister's home. Pt exhibits good LE strength, R UE limited by numbness and decreased reaction time for finger flexion into a fist on demand, but later is able to do it while talking to therapist. Pt with elevated BP throughout session within permissive guidelines. (See General Comments) Pt eager to get out of room to walk and initially is contact guard but winces and reports "head rush" after which pt with increased trunk flexion and knee buckling requiring mod A for safely returning to bed.  If ambulation is consistently contact guard during hospitalization PT recommending OP Neuro PT at discharge. PT will continue to follow acutely.       If plan is discharge home, recommend the following: A little help with walking and/or transfers;A lot of help with bathing/dressing/bathroom;Assistance with cooking/housework;Direct supervision/assist for medications management;Direct supervision/assist for financial management;Assist for transportation;Help with stairs or ramp for entrance   Can travel by private vehicle        Equipment Recommendations None recommended by PT  Recommendations for Other Services  OT consult    Functional Status Assessment  Patient has had a recent decline in their functional status and demonstrates the ability to make significant improvements in function in a reasonable and predictable amount of time.     Precautions / Restrictions Precautions Precautions: Fall Precaution Comments: quickly goes from stable to unstable Restrictions Weight Bearing Restrictions: No      Mobility  Bed Mobility Overal bed mobility: Modified Independent             General bed mobility comments: HoB elevated use of bed rail to pull to EoB    Transfers Overall transfer level: Needs assistance Equipment used: None Transfers: Sit to/from Stand Sit to Stand: Contact guard assist           General transfer comment: contact guard for safety, good power up and self steadying    Ambulation/Gait Ambulation/Gait assistance: Contact guard assist, Mod assist Gait Distance (Feet): 50 Feet Assistive device: None Gait Pattern/deviations: Knees buckling, Narrow base of support, Step-through pattern Gait velocity: variable Gait velocity interpretation: 1.31 - 2.62 ft/sec, indicative of limited community ambulator Pre-gait activities: marching in place General Gait Details: initially contact guard with strong steady gait, decreased R UE arm swing, with distance  pt reports "head rush" and posture becomes more flexed and knee buckling present modA needed to return to room and sit on EoB     Modified Rankin (Stroke Patients Only) Modified Rankin (Stroke Patients Only) Pre-Morbid Rankin Score: No symptoms Modified Rankin: Moderately severe disability     Balance Overall balance assessment: Needs assistance Sitting-balance support: Feet supported, No upper extremity supported Sitting balance-Leahy Scale: Good     Standing balance support: During functional activity, No upper  extremity supported Standing balance-Leahy Scale: Fair                               Pertinent Vitals/Pain Pain Assessment Pain  Assessment: No/denies pain    Home Living Family/patient expects to be discharged to:: Private residence Living Arrangements: Alone Available Help at Discharge: Family;Available PRN/intermittently Type of Home: House Home Access: Stairs to enter       Home Layout: One level   Additional Comments: pt with difficulty answering questions and becoming tearful, towards end of session state he might be able to go stay with his sister    Prior Function Prior Level of Function : Independent/Modified Independent                     Extremity/Trunk Assessment   Upper Extremity Assessment Upper Extremity Assessment: Defer to OT evaluation    Lower Extremity Assessment Lower Extremity Assessment: Overall WFL for tasks assessed (strength 4+/5, ROM WFL, senstation and coordination WFL)    Cervical / Trunk Assessment Cervical / Trunk Assessment: Kyphotic  Communication   Communication Communication: Difficulty communicating thoughts/reduced clarity of speech Cueing Techniques: Tactile cues;Visual cues;Verbal cues  Cognition Arousal: Alert Behavior During Therapy: Flat affect, Lability Overall Cognitive Status: Difficult to assess                                 General Comments: pt upset about level of impairment, and frustrated with not being able to find words to answer questions        General Comments General comments (skin integrity, edema, etc.): BP prior to ambulation 167/98 (116), sitting EoB after ambulation 180/109 (122), with return to supine for 3 min 171/99 (120) HR in 70s        Assessment/Plan    PT Assessment Patient needs continued PT services  PT Problem List Decreased activity tolerance;Decreased balance;Decreased mobility;Decreased strength;Cardiopulmonary status limiting activity;Decreased safety awareness       PT Treatment Interventions DME instruction;Gait training;Functional mobility training;Therapeutic activities;Stair  training;Therapeutic exercise;Balance training;Cognitive remediation;Patient/family education;Neuromuscular re-education    PT Goals (Current goals can be found in the Care Plan section)  Acute Rehab PT Goals Patient Stated Goal: be able to talk PT Goal Formulation: With patient Time For Goal Achievement: 02/01/23 Potential to Achieve Goals: Fair    Frequency Min 1X/week        AM-PAC PT "6 Clicks" Mobility  Outcome Measure Help needed turning from your back to your side while in a flat bed without using bedrails?: None Help needed moving from lying on your back to sitting on the side of a flat bed without using bedrails?: None Help needed moving to and from a bed to a chair (including a wheelchair)?: None Help needed standing up from a chair using your arms (e.g., wheelchair or bedside chair)?: None Help needed to walk in hospital room?: A Lot Help needed climbing 3-5 steps with a railing? : A Lot 6 Click Score: 20    End of Session Equipment Utilized During Treatment: Gait belt Activity Tolerance: Patient tolerated treatment well Patient left: in bed;with call bell/phone within reach;with bed alarm set Nurse Communication: Mobility status PT Visit Diagnosis: Unsteadiness on feet (R26.81);Other abnormalities of gait and mobility (R26.89);Muscle weakness (generalized) (M62.81);Difficulty in walking, not elsewhere classified (R26.2);Other symptoms and signs involving the nervous system (A54.098)    Time: 1191-4782 PT Time  Calculation (min) (ACUTE ONLY): 21 min   Charges:   PT Evaluation $PT Eval Moderate Complexity: 1 Mod   PT General Charges $$ ACUTE PT VISIT: 1 Visit         Tyniya Kuyper B. Beverely Risen PT, DPT Acute Rehabilitation Services Please use secure chat or  Call Office [phone]   Elon Alas Fleet 01/18/2023, 6:00 PM

## 2023-01-18 NOTE — Assessment & Plan Note (Signed)
UDS positive for THC ?

## 2023-01-18 NOTE — Progress Notes (Signed)
Brief same day note:  Patient is a 57 year old male with history of hypertension not on treatment, polysubstance abuse, GERD who presented with right sided numbness/confusion, expressive aphasia.  On pending send with hypertensive, mildly bradycardic.  UDS positive for THC.  CT head suspicious for a small posterior left frontal cortical infarct.  CTA head and neck showed focal severe left M2 stenosis.  Patient admitted for the management of stroke.  MRI pending.  Stroke workup initiated.  Patient seen and examined at bedside this morning.  He is alert and oriented but dysarthric and is weak on the right side. Discussed management plan with niece at bedside  Assessment and plan:  Acute ZOX:[id number] with right sided numbness/confusion, expressive aphasia.  CT imagings as above.  Stroke workup initiated.  MRI pending.  PT/OT/speech evaluation.  Echo pending.  Started on aspirin,plavix.  A1c of 5.3, LDL of 125.  Started on Lipitor  Polysubstance abuse: UDS positive for THC.  Counseled for cessation  Hypertension: Currently not on any medications.  Allow permissive hypertension.  Gradually normalise blood pressure in 5 days.  Continue as needed medicine for severe hypertension

## 2023-01-18 NOTE — Assessment & Plan Note (Signed)
-  uncontrolled HTN not on antihypertensives -PRN IV hydralazine for BP >220

## 2023-01-18 NOTE — Progress Notes (Addendum)
STROKE TEAM PROGRESS NOTE   BRIEF HPI [name] is a 57 y.o. male with history of substance use, HTN, GERD presenting with aphasia, R hand numbness. Pt believes it started on 10/10 when patient had nausea/vomiting and began feeling sick. Noticed weakness on right side and some slowing of speech. Has had word-finding problems and mild-moderate headaches since that time with occasional photophobia/phonophobia. Pt has no past history of strokes or cerebrovascular accidents. Pt is unaware of a family history of strokes. Pt admits to past history of cigarette smoking, past crack-cocaine use, and current THC use. Reports non daily use of alcohol. Reports hx of HTN, not on medication.   SIGNIFICANT HOSPITAL EVENTS 10/19 - CT Angio - L posterior frontal cortex infarct, M2 stenosis 10/20 - MRI shows L posterior frontal lobe, possible parietal lobe infarcts in an M2 distribution  INTERIM HISTORY/SUBJECTIVE Met patient bedside after the completion of his MRI. Pt had recently eaten late lunch. Dr Pearlean Brownie joined for a second exam where he explained to the patient's sister, patient and the friends present what had happened. Pt struggled with the clinical significance of what was happening. Pt had previously visited his primary care at [facility] for the numbness and decreased strength in right hand, they gave him "a nerve medicine". Came in partially as result of word finding difficulties. Explained that he had a stroke in the last few days and walked him through the significance of the imaging. Tried to explain next steps (getting cardiac workup, testing, rehab), but patient seemed to have some difficulties with comprehending.    OBJECTIVE  CBC    Component Value Date/Time   WBC 4.2 01/17/2023 1123   RBC 5.95 (H) 01/17/2023 1123   HGB 17.8 (H) 01/17/2023 1123   HCT 52.8 (H) 01/17/2023 1123   PLT 207 01/17/2023 1123   MCV 88.7 01/17/2023 1123   MCH 29.9 01/17/2023 1123   MCHC 33.7 01/17/2023 1123   RDW  14.3 01/17/2023 1123   LYMPHSABS 1.2 01/17/2023 1123   MONOABS 0.3 01/17/2023 1123   EOSABS 0.1 01/17/2023 1123   BASOSABS 0.0 01/17/2023 1123    BMET    Component Value Date/Time   NA 140 01/17/2023 1123   K 3.8 01/17/2023 1123   CL 102 01/17/2023 1123   CO2 29 01/17/2023 1123   GLUCOSE 97 01/17/2023 1123   BUN 10 01/17/2023 1123   CREATININE 1.09 01/17/2023 1123   CALCIUM 9.6 01/17/2023 1123   GFRNONAA >60 01/17/2023 1123    IMAGING past 24 hours CT ANGIO HEAD NECK W WO CM  Result Date: 01/17/2023 CLINICAL DATA:  2-3 weeks of memory impairment and slurred speech with word-finding difficulty. Numbness to the right arm. EXAM: CT ANGIOGRAPHY HEAD AND NECK WITH AND WITHOUT CONTRAST TECHNIQUE: Multidetector CT imaging of the head and neck was performed using the standard protocol during bolus administration of intravenous contrast. Multiplanar CT image reconstructions and MIPs were obtained to evaluate the vascular anatomy. Carotid stenosis measurements (when applicable) are obtained utilizing NASCET criteria, using the distal internal carotid diameter as the denominator. RADIATION DOSE REDUCTION: This exam was performed according to the departmental dose-optimization program which includes automated exposure control, adjustment of the mA and/or kV according to patient size and/or use of iterative reconstruction technique. CONTRAST:  75mL OMNIPAQUE IOHEXOL 350 MG/ML SOLN COMPARISON:  None Available. FINDINGS: CT HEAD FINDINGS Brain: Suspicion for small posterior left frontal cortex infarct as marked on series 5. No hemorrhage or hydrocephalus. No mass or collection detected. Vascular:  See below Skull: Normal Sinuses/Orbits: Normal Review of the MIP images confirms the above findings CTA NECK FINDINGS Aortic arch: Unremarkable.  Three vessel branching. Right carotid system: Vessels are smoothly contoured and widely patent. Minimal atheromatous plaque at the bulb. Left carotid system: Mild  atheromatous plaque at the ICA bulb/bifurcation. No stenosis, beading, or ulceration. Vertebral arteries: No proximal subclavian stenosis. The codominant vertebral arteries are smoothly contoured and diffusely patent. Skeleton: Unremarkable Other neck: Unremarkable Upper chest: Negative Review of the MIP images confirms the above findings CTA HEAD FINDINGS Anterior circulation: High-grade left M2 segment stenosis. No major branch occlusion, generalized beading, or aneurysm. Posterior circulation: The vertebral and basilar arteries are smoothly contoured and diffusely patent. No branch occlusion, beading, or aneurysm. Venous sinuses: Unremarkable for arterial timing Anatomic variants: None significant Review of the MIP images confirms the above findings IMPRESSION: Head CT: Suspect small recent infarct in the left posterior frontal cortex. CTA: Focal severe left M2 stenosis. This could be atheromatous plaque but atheromatous disease elsewhere is mild. Electronically Signed   By: Tiburcio Pea M.D.   On: 01/17/2023 12:30    Vitals:   01/17/23 1930 01/17/23 2038 01/17/23 2350 01/18/23 0400  BP: (!) 178/92 (!) 169/104 (!) 182/83 (!) 198/115  Pulse: (!) 57 63 (!) 50 (!) 53  Resp: 14 16 16 16   Temp:  98.4 F (36.9 C) 98 F (36.7 C) 98 F (36.7 C)  TempSrc:  Oral Oral   SpO2: 100% 100% 96% 100%  Weight:      Height:         PHYSICAL EXAM General: Alert, well-nourished, well-developed patient in no acute distress Psych: Mood fairly depressed.  CV: Regular rate and rhythm on monitor Respiratory:  Regular, unlabored respirations on room air GI: Abdomen soft and nontender   NEURO:  Mental Status: AA&Ox3, patient is able to give clear and coherent history Speech/Language: speech is without dysarthria or aphasia.  Naming, repetition, fluency, and comprehension intact.  Slightly nonfluent speech with mild bradyphrenia Cranial Nerves:  II: PERRL. Visual fields show right homonymous hemianopsia III,  IV, VI: R horizontal saccades when tracking vertically. Difficulty tracking past midline towards the right. Eyelids elevate symmetrically.  V: Sensation is intact to light touch and symmetrical to face.  VII: Face is symmetrical resting, mild R droop on smiling. VIII: hearing intact to voice. IX, X: Palate elevates symmetrically. Phonation is normal.  VH:QIONGEXB shrug 5/5. XII: tongue is midline without fasciculations. Motor: 5/5 strength to all muscle groups tested.  Tone: is normal and bulk is normal Sensation- Intact to light touch bilaterally. Extinction absent to light touch to DSS.   Coordination: FTN intact bilaterally, HKS: no ataxia in BLE.No drift.  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  CTA head & neck Focal severe left M2 stenosis  MRI   2D Echo - 60 to 65%, severe L atrial dilation, dilatation of aorta, atrial shunting noted. 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 Therapy recommendations:  Pending Disposition:  Likely home, pending.   Hypertension Home meds:  Amlodipine 10, losartan 50,  Stable Blood Pressure Goal: BP less than 220/110  Hyperlipidemia LDL 125, goal < 70 Add Crestor 40mg   Continue statin at discharge  Substance Abuse UDS positive for THC , counseling provided       Ready to quit? Yes TBD  Other Stroke Risk Factors ETOH use, alcohol level <10, advised to drink no more than 2 drink(s) a day Family hx stroke (Mother - acute MI in 73s, deceased) Migraines - [facility]. Will have to find alternative  medication   Other Active Problems   Hospital day # 1  Signed: Christie Nottingham, MD PGY-1 [facility] Physician 01/18/2023 4:48 PM   STROKE MD NOTE :  I have personally obtained history,examined this patient, reviewed notes, independently viewed imaging studies, participated in medical decision making and plan of care.ROS completed by me personally and pertinent positives fully documented  I have made any additions or clarifications directly to the above note. Agree with note above.  Patient presented with subacute symptoms of right hand numbness which was evaluated as an outpatient at the [facility] and thought to be a peripheral nerve etiology but noticed some speech and word finding difficulties in the last few days.  MRI scan shows a patchy embolic left posterior division MCA branch infarct with CT angiogram showing corresponding high-grade left M2 stenosis.  Recommend dual antiplatelet therapy for 3 weeks followed by aspirin alone and aggressive risk factor modification.  Patient counseled to quit smoking cigarettes and marijuana.  Check echocardiogram results and likely need TEE to look for cardiac source of embolism and prolonged cardiac monitoring at discharge.  Long discussion with patient and sister at the bedside and his friends and answered questions.  Greater than 50% time during this 50-minute visit was spent on counseling and coordination of care and discussion patient and care team answering questions.  Delia Heady, MD Medical Director [facility] Pager: [phone] 01/18/2023 5:21 PM   To contact Stroke Continuity provider, please refer to [email or web address]. After hours, contact General Neurology

## 2023-01-19 ENCOUNTER — Inpatient Hospital Stay (HOSPITAL_COMMUNITY): Payer: No Typology Code available for payment source

## 2023-01-19 ENCOUNTER — Other Ambulatory Visit: Payer: Self-pay

## 2023-01-19 ENCOUNTER — Inpatient Hospital Stay (HOSPITAL_COMMUNITY): Payer: No Typology Code available for payment source | Admitting: Anesthesiology

## 2023-01-19 ENCOUNTER — Encounter (HOSPITAL_COMMUNITY): Payer: No Typology Code available for payment source

## 2023-01-19 ENCOUNTER — Encounter (HOSPITAL_COMMUNITY)
Admission: EM | Disposition: A | Discharge status: Home / Self Care | Payer: Self-pay | Source: Home / Self Care | Attending: Internal Medicine

## 2023-01-19 ENCOUNTER — Other Ambulatory Visit (HOSPITAL_COMMUNITY): Payer: Self-pay

## 2023-01-19 ENCOUNTER — Encounter (HOSPITAL_COMMUNITY): Payer: Self-pay | Admitting: Internal Medicine

## 2023-01-19 DIAGNOSIS — I10 Essential (primary) hypertension: Secondary | ICD-10-CM

## 2023-01-19 DIAGNOSIS — I639 Cerebral infarction, unspecified: Secondary | ICD-10-CM

## 2023-01-19 HISTORY — PX: TRANSESOPHAGEAL ECHOCARDIOGRAM (CATH LAB): EP1270

## 2023-01-19 SURGERY — TRANSESOPHAGEAL ECHOCARDIOGRAM (TEE) (CATHLAB)
Anesthesia: Monitor Anesthesia Care

## 2023-01-19 MED ORDER — SODIUM CHLORIDE 0.9% FLUSH: 10.0000 mL | Freq: Two times a day (BID) | INTRAVENOUS | Status: DC

## 2023-01-19 MED ORDER — AMLODIPINE BESYLATE 5 MG PO TABS
10.0000 mg | ORAL_TABLET | Freq: Every day | ORAL | Status: DC
  Administered 2023-01-19: 10 mg via ORAL
  Filled 2023-01-19: qty 2

## 2023-01-19 MED ORDER — LOSARTAN POTASSIUM 50 MG PO TABS
50.0000 mg | ORAL_TABLET | Freq: Every day | ORAL | 0 refills | Status: DC
  Filled 2023-01-19 – 2023-02-02 (×2): qty 30, 30d supply, fill #0

## 2023-01-19 MED ORDER — ROSUVASTATIN CALCIUM 40 MG PO TABS
40.0000 mg | ORAL_TABLET | Freq: Every day | ORAL | 0 refills | Status: AC
  Filled 2023-01-19 – 2023-02-02 (×2): qty 30, 30d supply, fill #0

## 2023-01-19 MED ORDER — PROPOFOL 500 MG/50ML IV EMUL
INTRAVENOUS | Status: DC | PRN
  Administered 2023-01-19: 125 ug/kg/min via INTRAVENOUS
  Administered 2023-01-19: 30 mg via INTRAVENOUS

## 2023-01-19 MED ORDER — SODIUM CHLORIDE 0.9 % IV SOLN: INTRAVENOUS | Status: DC | PRN

## 2023-01-19 MED ORDER — PANTOPRAZOLE SODIUM 20 MG PO TBEC
20.0000 mg | DELAYED_RELEASE_TABLET | Freq: Every day | ORAL | 0 refills | Status: DC
  Filled 2023-01-19: qty 28, 28d supply, fill #0

## 2023-01-19 MED ORDER — CLOPIDOGREL BISULFATE 75 MG PO TABS
75.0000 mg | ORAL_TABLET | Freq: Every day | ORAL | 0 refills | Status: AC
  Filled 2023-01-19: qty 20, 20d supply, fill #0

## 2023-01-19 MED ORDER — PANTOPRAZOLE SODIUM 20 MG PO TBEC
20.0000 mg | DELAYED_RELEASE_TABLET | Freq: Every day | ORAL | 0 refills | Status: AC
  Filled 2023-02-02: qty 30, 30d supply, fill #0

## 2023-01-19 MED ORDER — AMLODIPINE BESYLATE 10 MG PO TABS
10.0000 mg | ORAL_TABLET | Freq: Every day | ORAL | 0 refills | Status: DC
  Filled 2023-01-19 – 2023-02-02 (×2): qty 30, 30d supply, fill #0

## 2023-01-19 MED ORDER — LOSARTAN POTASSIUM 50 MG PO TABS
50.0000 mg | ORAL_TABLET | Freq: Every day | ORAL | Status: DC
  Administered 2023-01-19: 50 mg via ORAL
  Filled 2023-01-19: qty 1

## 2023-01-19 MED ORDER — ASPIRIN 81 MG PO CHEW
81.0000 mg | CHEWABLE_TABLET | Freq: Every day | ORAL | 0 refills | Status: AC
  Filled 2023-01-19 – 2023-02-02 (×2): qty 30, 30d supply, fill #0

## 2023-01-19 NOTE — Discharge Summary (Signed)
Physician Discharge Summary  Richard Barrera ZOX:[id number] DOB: [birth year] DOA: 01/17/2023  PCP: [facility]  Admit date: 01/17/2023 Discharge date: 01/19/2023  Admitted From: Home Disposition:  Home  Discharge Condition:Stable CODE STATUS:FULL Diet recommendation: Heart Healty Brief/Interim Summary: Patient is a 57 year old male with history of hypertension not on treatment, polysubstance abuse, GERD who presented with right sided numbness/confusion, expressive aphasia.  On pending send with hypertensive, mildly bradycardic.  UDS positive for THC.  CT head suspicious for a small posterior left frontal cortical infarct.  CTA head and neck showed focal severe left M2 stenosis.  MRI showed scattered foci of restricted diffusion in the cortical and subcortical white matter of the posterior left frontal and parietal lobe as well as the lateral aspect of the left occipital lobe.  Patient was admitted for stroke workup.  Stroke order completed.  PT/OT recommend outpatient follow-up.  Medically stable for discharge home today.  He will follow-up with neurology as an outpatient.  Following problems were addressed during the hospitalization:  Acute WJX:[id number] with right sided numbness/confusion, expressive aphasia.  CT, MRI imaging showed above.  Embolic stroke suspected.  TEE done which showed EF of 60 to 65%, normal regional wall, some rheumatic, no thrombus or mass. Started on aspirin,plavix: Plan for 3 weeks followed by aspirin monotherapy.  A1c of 5.3, LDL of 125.  Started on Crestor. He will follow-up with neurology as an outpatient in 6 to 8 weeks.  PT/OT recommend outpatient follow-up.  Stroke workup completed. We have requested cardiology to arrange 30-day heart monitor.   Polysubstance abuse: UDS positive for THC.  Counseled for cessation   Hypertension: Suspect noncompliance.  Hypertensive here.  Started on amlodipine and losartan.  Discharge Diagnoses:  Principal  Problem:   CVA (cerebral vascular accident) (HCC) Active Problems:   HTN (hypertension)   Polysubstance abuse [facility])    Discharge Instructions  Discharge Instructions     Ambulatory referral to Neurology   Complete by: As directed    An appointment is requested in approximately: 8 weeks   Diet - low sodium heart healthy   Complete by: As directed    Discharge instructions   Complete by: As directed    1)Please take prescribed medications as instructed 2)Follow up with your PCP in a week 3)Follow up with neurology as an outpatient in 4 to 8 weeks. 4)Please stop smoking ,marijuana intake   Increase activity slowly   Complete by: As directed       Allergies as of 01/19/2023   No Known Allergies      Medication List     STOP taking these medications    diphenhydrAMINE 25 mg capsule Commonly known as: BENADRYL   predniSONE 10 MG tablet Commonly known as: DELTASONE       TAKE these medications    amitriptyline 25 MG tablet Commonly known as: ELAVIL Take 12.5 mg by mouth 2 (two) times daily.   amLODipine 10 MG tablet Commonly known as: NORVASC Take 1 tablet (10 mg total) by mouth daily.   aspirin 81 MG chewable tablet Chew 1 tablet (81 mg total) by mouth daily. Start taking on: January 20, 2023   clopidogrel 75 MG tablet Commonly known as: PLAVIX Take 1 tablet (75 mg total) by mouth daily for 20 days. Start taking on: January 20, 2023   famotidine 20 MG tablet Commonly known as: PEPCID Take 1 tablet (20 mg total) by mouth 2 (two) times daily.   fluticasone 50 MCG/ACT nasal spray Commonly known as:  FLONASE Place 2 sprays into both nostrils daily. What changed:  when to take this reasons to take this   gabapentin 100 MG capsule Commonly known as: NEURONTIN Take 100 mg by mouth 3 (three) times daily.   loratadine 10 MG tablet Commonly known as: CLARITIN Take 10 mg by mouth daily.   losartan 50 MG tablet Commonly known as: COZAAR Take 1  tablet (50 mg total) by mouth daily. What changed: how much to take   mirtazapine 7.5 MG tablet Commonly known as: REMERON Take 7.5 mg by mouth at bedtime as needed (for pain/sleep).   omeprazole 20 MG capsule Commonly known as: PRILOSEC Take 20 mg by mouth daily as needed (Indigestion).   ondansetron 4 MG disintegrating tablet Commonly known as: ZOFRAN-ODT Take 1 tablet (4 mg total) by mouth every 8 (eight) hours as needed for nausea or vomiting.   promethazine 25 MG tablet Commonly known as: PHENERGAN Take 25 mg by mouth 3 (three) times daily as needed for nausea or vomiting.   rosuvastatin 40 MG tablet Commonly known as: CRESTOR Take 1 tablet (40 mg total) by mouth daily. Start taking on: January 20, 2023   sildenafil 100 MG tablet Commonly known as: VIAGRA Take 100 mg by mouth daily as needed for erectile dysfunction.   SUMAtriptan 50 MG tablet Commonly known as: IMITREX Take 50 mg by mouth every 2 (two) hours as needed for migraine.        Follow-up Information     [facility]. Schedule an appointment as soon as possible for a visit in 4 week(s).   Specialty: Neurology Contact information: [address] [place] [address] [phone]        [facility] Schedule an appointment as soon as possible for a visit in 4 week(s).   Contact information: [address] [facility] [place] [address] [phone]                No Known Allergies  Consultations: Neurology   Procedures/Studies: EP STUDY  Result Date: 01/19/2023 See surgical note for result.  MR BRAIN WO CONTRAST  Result Date: 01/18/2023 CLINICAL DATA:  Neuro deficit, acute, stroke suspected. Slurred speech and word-finding difficulty. Numbness of the right upper extremity. EXAM: MRI HEAD WITHOUT CONTRAST TECHNIQUE: Multiplanar, multiecho pulse sequences of the brain and surrounding structures were  obtained without intravenous contrast. COMPARISON:  CT angio head and neck 01/17/2023 FINDINGS: Brain: Scattered foci of restricted diffusion are present in the cortical and subcortical white matter of the posterior left frontal and parietal lobe. Foci of restricted diffusion are present in the lateral aspect of left occipital lobe. T2 and FLAIR hyperintensities are associated with the areas of restricted diffusion. Minimal subcortical T2 hyperintensities otherwise are within normal limits for age. A remote lacunar infarct is present within the right lentiform nucleus. Deep brain nuclei are otherwise within normal limits. The ventricles are of normal size. No significant extraaxial fluid collection is present. The brainstem and cerebellum are within normal limits. The internal auditory canals are within normal limits. Midline structures are within normal limits. Vascular: Flow is present in the major intracranial arteries. Skull and upper cervical spine: The craniocervical junction is normal. Upper cervical spine is within normal limits. Marrow signal is unremarkable. Sinuses/Orbits: The paranasal sinuses and mastoid air cells are clear. The globes and orbits are within normal limits. IMPRESSION: 1. Scattered foci of restricted diffusion in the cortical and subcortical white matter of the posterior left frontal  and parietal lobe as well as the lateral aspect of the left occipital lobe. 2. Remote lacunar infarct of the right lentiform nucleus. Electronically Signed   By: Marin Roberts M.D.   On: 01/18/2023 18:42   ECHOCARDIOGRAM COMPLETE BUBBLE STUDY  Result Date: 01/18/2023    ECHOCARDIOGRAM REPORT   [name] Date of Exam: 01/18/2023 Medical Rec #:  [id number]      Height:       74.0 in Accession #:    [id number]     Weight:       185.0 lb Date of Birth:  10-23-1965      BSA:          2.103 m Patient Age:    57 years       BP:           215/138 mmHg Patient Gender: M              HR:            56 bpm. Exam Location:  Inpatient Procedure: 2D Echo, Color Doppler, Cardiac Doppler and Saline Contrast Bubble            Study Indications:    Stroke  History:        Patient has no prior history of Echocardiogram examinations.                 Risk Factors:Dyslipidemia and Former Smoker.  Sonographer:    Raeford Razor RDCS Referring Phys: [id number] [facility] IMPRESSIONS  1. Evidence of apical hypertrophy, 19 mm on apical short axis, with no evidence of thrombus or aneurysm. Left ventricular ejection fraction, by estimation, is 60 to 65%. The left ventricle has normal function. The left ventricle has no regional wall motion abnormalities. There is severe asymmetric left ventricular hypertrophy of the apical segment. Left ventricular diastolic parameters are consistent with Grade I diastolic dysfunction (impaired relaxation).  2. Right ventricular systolic function is normal. The right ventricular size is normal. Mildly increased right ventricular wall thickness.  3. Left atrial size was severely dilated.  4. The mitral valve is abnormal. No evidence of mitral valve regurgitation.  5. The aortic valve is tricuspid. Aortic valve regurgitation is not visualized. No aortic stenosis is present.  6. Aortic dilatation noted. There is mild dilatation of the ascending aorta, measuring 42 mm.  7. Evidence of atrial level shunting detected by color flow Doppler. Comparison(s): No prior Echocardiogram. Conclusion(s)/Recommendation(s): Consider outpatient CMR for evaluation of apical HCM. FINDINGS  Left Ventricle: Evidence of apical hypertrophy, 19 mm on apical short axis, with no evidence of thrombus or aneurysm. Left ventricular ejection fraction, by estimation, is 60 to 65%. The left ventricle has normal function. The left ventricle has no regional wall motion abnormalities. Global longitudinal strain performed but not reported based on interpreter judgement due to suboptimal tracking. The left ventricular  internal cavity size was normal in size. There is severe asymmetric left ventricular  hypertrophy of the apical segment. Left ventricular diastolic parameters are consistent with Grade I diastolic dysfunction (impaired relaxation). Right Ventricle: The right ventricular size is normal. Mildly increased right ventricular wall thickness. Right ventricular systolic function is normal. Left Atrium: Left atrial size was severely dilated. Right Atrium: Right atrial size was normal in size. Pericardium: There is no evidence of pericardial effusion. Mitral Valve: The mitral valve is abnormal. No evidence of mitral valve regurgitation. Tricuspid Valve: The tricuspid valve is normal in structure. Tricuspid valve regurgitation is  trivial. No evidence of tricuspid stenosis. Aortic Valve: The aortic valve is tricuspid. Aortic valve regurgitation is not visualized. No aortic stenosis is present. Aortic valve mean gradient measures 3.0 mmHg. Aortic valve peak gradient measures 5.9 mmHg. Pulmonic Valve: The pulmonic valve was normal in structure. Pulmonic valve regurgitation is not visualized. No evidence of pulmonic stenosis. Aorta: Aortic dilatation noted. There is mild dilatation of the ascending aorta, measuring 42 mm. IAS/Shunts: Evidence of atrial level shunting detected by color flow Doppler. Agitated saline contrast was given intravenously to evaluate for intracardiac shunting.  LEFT VENTRICLE PLAX 2D LVIDd:         4.70 cm   Diastology LVIDs:         2.70 cm   LV e' medial:    5.33 cm/s LV PW:         1.20 cm   LV E/e' medial:  6.7 LV IVS:        1.10 cm   LV e' lateral:   8.16 cm/s LVOT diam:     2.30 cm   LV E/e' lateral: 4.4 LVOT Area:     4.15 cm  RIGHT VENTRICLE             IVC RV Basal diam:  2.80 cm     IVC diam: 1.40 cm RV S prime:     16.80 cm/s TAPSE (M-mode): 2.3 cm LEFT ATRIUM              Index        RIGHT ATRIUM           Index LA diam:        4.50 cm  2.14 cm/m   RA Area:     22.10 cm LA Vol (A2C):    101.0 ml 48.04 ml/m  RA Volume:   61.20 ml  29.11 ml/m LA Vol (A4C):   106.0 ml 50.41 ml/m LA Biplane Vol: 108.0 ml 51.36 ml/m  AORTIC VALVE AV Area (Vmax): 3.27 cm AV Vmax:        121.00 cm/s AV Vmean:       80.100 cm/s AV VTI:         0.267 m AV Peak Grad:   5.9 mmHg AV Mean Grad:   3.0 mmHg LVOT Vmax:      95.30 cm/s  AORTA Ao Root diam: 3.50 cm Ao Asc diam:  4.10 cm MITRAL VALVE MV Area (PHT): 1.67 cm    SHUNTS MV Decel Time: 454 msec    Systemic Diam: 2.30 cm MV E velocity: 35.60 cm/s MV A velocity: 45.60 cm/s MV E/A ratio:  0.78 Riley Lam MD Electronically signed by Riley Lam MD Signature Date/Time: 01/18/2023/1:56:52 PM    Final    CT ANGIO HEAD NECK W WO CM  Result Date: 01/17/2023 CLINICAL DATA:  2-3 weeks of memory impairment and slurred speech with word-finding difficulty. Numbness to the right arm. EXAM: CT ANGIOGRAPHY HEAD AND NECK WITH AND WITHOUT CONTRAST TECHNIQUE: Multidetector CT imaging of the head and neck was performed using the standard protocol during bolus administration of intravenous contrast. Multiplanar CT image reconstructions and MIPs were obtained to evaluate the vascular anatomy. Carotid stenosis measurements (when applicable) are obtained utilizing NASCET criteria, using the distal internal carotid diameter as the denominator. RADIATION DOSE REDUCTION: This exam was performed according to the departmental dose-optimization program which includes automated exposure control, adjustment of the mA and/or kV according to patient size and/or use of iterative reconstruction technique. CONTRAST:  75mL  OMNIPAQUE IOHEXOL 350 MG/ML SOLN COMPARISON:  None Available. FINDINGS: CT HEAD FINDINGS Brain: Suspicion for small posterior left frontal cortex infarct as marked on series 5. No hemorrhage or hydrocephalus. No mass or collection detected. Vascular: See below Skull: Normal Sinuses/Orbits: Normal Review of the MIP images confirms the above findings CTA NECK  FINDINGS Aortic arch: Unremarkable.  Three vessel branching. Right carotid system: Vessels are smoothly contoured and widely patent. Minimal atheromatous plaque at the bulb. Left carotid system: Mild atheromatous plaque at the ICA bulb/bifurcation. No stenosis, beading, or ulceration. Vertebral arteries: No proximal subclavian stenosis. The codominant vertebral arteries are smoothly contoured and diffusely patent. Skeleton: Unremarkable Other neck: Unremarkable Upper chest: Negative Review of the MIP images confirms the above findings CTA HEAD FINDINGS Anterior circulation: High-grade left M2 segment stenosis. No major branch occlusion, generalized beading, or aneurysm. Posterior circulation: The vertebral and basilar arteries are smoothly contoured and diffusely patent. No branch occlusion, beading, or aneurysm. Venous sinuses: Unremarkable for arterial timing Anatomic variants: None significant Review of the MIP images confirms the above findings IMPRESSION: Head CT: Suspect small recent infarct in the left posterior frontal cortex. CTA: Focal severe left M2 stenosis. This could be atheromatous plaque but atheromatous disease elsewhere is mild. Electronically Signed   By: Tiburcio Pea M.D.   On: 01/17/2023 12:30      Subjective: Patient seen and examined at the bedside today.  He looks more comfortable than yesterday.  His speech has almost clear.  His right sided weakness has significantly improved today.  Patient is very eager to go home. I discussed the discharge planning with the sister on phone as well  Discharge Exam: Vitals:   01/19/23 1225 01/19/23 1230  BP: (!) 185/102   Pulse: 61 61  Resp: 14 14  Temp: (!) 97.5 F (36.4 C)   SpO2: 96% 100%   Vitals:   01/19/23 1220 01/19/23 1224 01/19/23 1225 01/19/23 1230  BP: (!) 176/109 (!) 185/102 (!) 185/102   Pulse: 66 62 61 61  Resp: (!) 24 17 14 14   Temp:   (!) 97.5 F (36.4 C)   TempSrc:   Temporal   SpO2: 95% 96% 96% 100%  Weight:       Height:        General: Pt is alert, awake, not in acute distress Cardiovascular: RRR, S1/S2 +, no rubs, no gallops Respiratory: CTA bilaterally, no wheezing, no rhonchi Abdominal: Soft, NT, ND, bowel sounds + Extremities: no edema, no cyanosis, mild right-sided weakness    The results of significant diagnostics from this hospitalization (including imaging, microbiology, ancillary and laboratory) are listed below for reference.     Microbiology: No results found for this or any previous visit (from the past 240 hour(s)).   Labs: BNP (last 3 results) No results for input(s): "BNP" in the last 8760 hours. Basic Metabolic Panel: Recent Labs  Lab 01/17/23 1123  NA 140  K 3.8  CL 102  CO2 29  GLUCOSE 97  BUN 10  CREATININE 1.09  CALCIUM 9.6   Liver Function Tests: Recent Labs  Lab 01/17/23 1123  AST 15  ALT 12  ALKPHOS 52  BILITOT 0.7  PROT 7.4  ALBUMIN 4.2   No results for input(s): "LIPASE", "AMYLASE" in the last 168 hours. Recent Labs  Lab 01/17/23 1132 01/17/23 1253  AMMONIA 302* 19   CBC: Recent Labs  Lab 01/17/23 1123  WBC 4.2  NEUTROABS 2.6  HGB 17.8*  HCT 52.8*  MCV 88.7  PLT 207   Cardiac Enzymes: No results for input(s): "CKTOTAL", "CKMB", "CKMBINDEX", "TROPONINI" in the last 168 hours. BNP: Invalid input(s): "POCBNP" CBG: Recent Labs  Lab 01/17/23 1027  GLUCAP 97   D-Dimer No results for input(s): "DDIMER" in the last 72 hours. Hgb A1c Recent Labs    01/18/23 0638  HGBA1C 5.3   Lipid Profile Recent Labs    01/18/23 0638  CHOL 179  HDL 36*  LDLCALC 125*  TRIG 91  CHOLHDL 5.0   Thyroid function studies Recent Labs    01/17/23 1123  TSH 1.778   Anemia work up No results for input(s): "VITAMINB12", "FOLATE", "FERRITIN", "TIBC", "IRON", "RETICCTPCT" in the last 72 hours. Urinalysis    Component Value Date/Time   COLORURINE YELLOW 04/14/2010 0644   APPEARANCEUR CLEAR 04/14/2010 0644   LABSPEC 1.018 04/14/2010  0644   PHURINE 6.5 04/14/2010 0644   GLUCOSEU NEGATIVE 02/01/2010 0538   HGBUR NEGATIVE 04/14/2010 0644   BILIRUBINUR NEGATIVE 04/14/2010 0644   KETONESUR NEGATIVE 04/14/2010 0644   PROTEINUR NEGATIVE 04/14/2010 0644   UROBILINOGEN 1.0 04/14/2010 0644   NITRITE NEGATIVE 04/14/2010 0644   LEUKOCYTESUR  04/14/2010 0644    NEGATIVE MICROSCOPIC NOT DONE ON URINES WITH NEGATIVE PROTEIN, BLOOD, LEUKOCYTES, NITRITE, OR GLUCOSE <1000 mg/dL.   Sepsis Labs Recent Labs  Lab 01/17/23 1123  WBC 4.2   Microbiology No results found for this or any previous visit (from the past 240 hour(s)).  Please note: You were cared for by a hospitalist during your hospital stay. Once you are discharged, your primary care physician will handle any further medical issues. Please note that NO REFILLS for any discharge medications will be authorized once you are discharged, as it is imperative that you return to your primary care physician (or establish a relationship with a primary care physician if you do not have one) for your post hospital discharge needs so that they can reassess your need for medications and monitor your lab values.    Time coordinating discharge: 40 minutes  SIGNED:   Burnadette Pop, MD  [facility] 01/19/2023, 12:56 PM Pager [phone]  If 7PM-7AM, please contact night-coverage [email or web address] Password [email or web address]

## 2023-01-19 NOTE — Progress Notes (Signed)
   [facility] has been requested to perform a transesophageal echocardiogram on Richard Barrera for evaluation of embolic stroke. Patient is a 57 year old male with a past medical history of HTN, substance use, GERD. Presented with aphasia on 10/19 with aphasia. Found to have a small recent L posterior frontal infarct along with L MCA M2 severe stenosis. Underwent echocardiogram that showed EF 60-65%, no regional wall motion abnormalities, severe asymmetric LVH, grade I DD, normal RV function, mild dilation of the ascending aorta.   After careful review of history and examination, the risks and benefits of transesophageal echocardiogram have been explained including risks of esophageal damage, perforation (1:10,000 risk), bleeding, pharyngeal hematoma as well as other potential complications associated with conscious sedation including aspiration, arrhythmia, respiratory failure and death. Alternatives to treatment were discussed, questions were answered. Patient is willing to proceed.   Jonita Albee, PA-C 01/19/2023 7:36 AM

## 2023-01-19 NOTE — Anesthesia Postprocedure Evaluation (Signed)
Anesthesia Post Note  Patient: Richard Barrera  Procedure(s) Performed: TRANSESOPHAGEAL ECHOCARDIOGRAM     Patient location during evaluation: PACU Anesthesia Type: MAC Level of consciousness: awake and alert Pain management: pain level controlled Vital Signs Assessment: post-procedure vital signs reviewed and stable Respiratory status: spontaneous breathing, nonlabored ventilation and respiratory function stable Cardiovascular status: blood pressure returned to baseline and stable Postop Assessment: no apparent nausea or vomiting Anesthetic complications: no   No notable events documented.  Last Vitals:  Vitals:   01/19/23 1225 01/19/23 1230  BP: (!) 185/102   Pulse: 61 61  Resp: 14 14  Temp: (!) 36.4 C   SpO2: 96% 100%    Last Pain:  Vitals:   01/19/23 1225  TempSrc: Temporal  PainSc:                  Lowella Curb

## 2023-01-19 NOTE — Anesthesia Preprocedure Evaluation (Signed)
Anesthesia Evaluation  Patient identified by MRN, date of birth, ID band Patient awake    Reviewed: Allergy & Precautions, H&P , NPO status , Patient's Chart, lab work & pertinent test results  Airway Mallampati: II  TM Distance: >3 FB Neck ROM: Full    Dental no notable dental hx.    Pulmonary neg pulmonary ROS, former smoker   Pulmonary exam normal breath sounds clear to auscultation       Cardiovascular hypertension, Pt. on medications negative cardio ROS Normal cardiovascular exam Rhythm:Regular Rate:Normal     Neuro/Psych CVA  negative psych ROS   GI/Hepatic negative GI ROS, Neg liver ROS,,,  Endo/Other  negative endocrine ROS    Renal/GU negative Renal ROS  negative genitourinary   Musculoskeletal negative musculoskeletal ROS (+)    Abdominal   Peds negative pediatric ROS (+)  Hematology negative hematology ROS (+)   Anesthesia Other Findings   Reproductive/Obstetrics negative OB ROS                             Anesthesia Physical Anesthesia Plan  ASA: 3  Anesthesia Plan: MAC   Post-op Pain Management: Minimal or no pain anticipated   Induction: Intravenous  PONV Risk Score and Plan: 1 and Propofol infusion and Treatment may vary due to age or medical condition  Airway Management Planned: Nasal Cannula  Additional Equipment:   Intra-op Plan:   Post-operative Plan:   Informed Consent: I have reviewed the patients History and Physical, chart, labs and discussed the procedure including the risks, benefits and alternatives for the proposed anesthesia with the patient or authorized representative who has indicated his/her understanding and acceptance.     Dental advisory given  Plan Discussed with: CRNA  Anesthesia Plan Comments:        Anesthesia Quick Evaluation

## 2023-01-19 NOTE — TOC CAGE-AID Note (Signed)
Transition of Care [facility]) - CAGE-AID Screening   Patient Details  Name: Richard Barrera MRN: [id number] Date of Birth: 08/21/1965  Transition of Care [facility]) CM/SW Contact:    Kermit Balo, RN Phone Number: 01/19/2023, 1:27 PM   Clinical Narrative:  Pt denied the need for substance abuse resources.   CAGE-AID Screening:    Have You Ever Felt You Ought to Cut Down on Your Drinking or Drug Use?: No Have People Annoyed You By Critizing Your Drinking Or Drug Use?: No Have You Felt Bad Or Guilty About Your Drinking Or Drug Use?: No Have You Ever Had a Drink or Used Drugs First Thing In The Morning to Steady Your Nerves or to Get Rid of a Hangover?: No CAGE-AID Score: 0  Substance Abuse Education Offered: Yes (refused)

## 2023-01-19 NOTE — CV Procedure (Signed)
    TRANSESOPHAGEAL ECHOCARDIOGRAM   NAME:  Richard Barrera   MRN: [id number] DOB:  1965/11/03   ADMIT DATE: 01/17/2023  INDICATIONS: CVA  PROCEDURE:   Informed consent was obtained prior to the procedure. The risks, benefits and alternatives for the procedure were discussed and the patient comprehended these risks.  Risks include, but are not limited to, cough, sore throat, vomiting, nausea, somnolence, esophageal and stomach trauma or perforation, bleeding, low blood pressure, aspiration, pneumonia, infection, trauma to the teeth and death.    Procedural time out performed.   Patient received monitored anesthesia care under the supervision of Dr. Hyacinth Meeker. Patient received a total of 212.48 mg propofol during the procedure.  The transesophageal probe was inserted in the esophagus and stomach without difficulty and multiple views were obtained.    COMPLICATIONS:    There were no immediate complications.  FINDINGS:  LEFT VENTRICLE: EF = 60-65%. No regional wall motion abnormalities.  RIGHT VENTRICLE: Normal size and function.   LEFT ATRIUM: No thrombus/mass.  LEFT ATRIAL APPENDAGE: No thrombus/mass.   RIGHT ATRIUM: No thrombus/mass.  AORTIC VALVE:  Trileaflet. No regurgitation. No vegetation.  MITRAL VALVE:    Mildly degenerative structure. Trivial regurgitation. No vegetation.  TRICUSPID VALVE: Normal structure. Trivial regurgitation. No vegetation.  PULMONIC VALVE: Grossly normal structure. Trivial regurgitation. No apparent vegetation.  INTERATRIAL SEPTUM: No PFO or ASD seen by color Doppler. Agitated saline negative for intracardiac shunt, late positive bubble suggests extracardiac shunt  PERICARDIUM: Trivial effusion noted.  DESCENDING AORTA: Mild plaque seen   CONCLUSION:Negative for cardiac source of embolism. No PFO or ASD seen by color Doppler. Agitated saline negative for intracardiac shunt, late positive bubble suggests extracardiac shunt   Jodelle Red, MD, PhD, [facility] [place]  [facility]  [place]  [facility] at [facility] at [facility] [address] [place], [place] [address] [phone]   11:51 AM

## 2023-01-19 NOTE — Progress Notes (Signed)
Patient went to the Cath Lab for the Transesophageal echocardiogra.

## 2023-01-19 NOTE — Progress Notes (Addendum)
Of it was open new clot close appointment STROKE TEAM PROGRESS NOTE   BRIEF HPI [name] is a 57 y.o. male with history of substance use, HTN, GERD presenting with aphasia, R hand numbness. Pt believes it started on 10/10 when patient had nausea/vomiting and began feeling sick. Noticed weakness on right side and some slowing of speech. Has had word-finding problems and mild-moderate headaches since that time with occasional photophobia/phonophobia. Pt has no past history of strokes or cerebrovascular accidents. Pt is unaware of a family history of strokes. Pt admits to past history of cigarette smoking, past crack-cocaine use, and current THC use. Reports non daily use of alcohol. Reports hx of HTN, not on medication.   SIGNIFICANT HOSPITAL EVENTS 10/19 - CT Angio - L posterior frontal cortex infarct, M2 stenosis 10/20 - MRI shows L posterior frontal lobe, possible parietal lobe infarcts in an M2 distribution. Echocardiogram showed: EF 60-65%, no regional wall motion abnormalities, severe asymmetric LVH, grade I DD, normal RV function, mild dilation of the ascending aorta  10/21 - TEE was Negative for cardiac source of embolism. No PFO or ASD seen by color Doppler. Agitated saline negative for intracardiac shunt, late positive bubble suggests extracardiac shunt   INTERIM HISTORY/SUBJECTIVE Met patient bedside shortly before his TEE, he reported no new concerns from a neuro standpoint. Mentioned again his weakness and numbness in his right hand. He still is having memory deficits of which he is seemingly unaware. Pt voiced no other acute concerns at this time, just looking forward to going home.   OBJECTIVE  CBC    Component Value Date/Time   WBC 4.2 01/17/2023 1123   RBC 5.95 (H) 01/17/2023 1123   HGB 17.8 (H) 01/17/2023 1123   HCT 52.8 (H) 01/17/2023 1123   PLT 207 01/17/2023 1123   MCV 88.7 01/17/2023 1123   MCH 29.9 01/17/2023 1123   MCHC 33.7 01/17/2023 1123   RDW 14.3  01/17/2023 1123   LYMPHSABS 1.2 01/17/2023 1123   MONOABS 0.3 01/17/2023 1123   EOSABS 0.1 01/17/2023 1123   BASOSABS 0.0 01/17/2023 1123    BMET    Component Value Date/Time   NA 140 01/17/2023 1123   K 3.8 01/17/2023 1123   CL 102 01/17/2023 1123   CO2 29 01/17/2023 1123   GLUCOSE 97 01/17/2023 1123   BUN 10 01/17/2023 1123   CREATININE 1.09 01/17/2023 1123   CALCIUM 9.6 01/17/2023 1123   GFRNONAA >60 01/17/2023 1123    IMAGING past 24 hours EP STUDY  Result Date: 01/19/2023 See surgical note for result.  MR BRAIN WO CONTRAST  Result Date: 01/18/2023 CLINICAL DATA:  Neuro deficit, acute, stroke suspected. Slurred speech and word-finding difficulty. Numbness of the right upper extremity. EXAM: MRI HEAD WITHOUT CONTRAST TECHNIQUE: Multiplanar, multiecho pulse sequences of the brain and surrounding structures were obtained without intravenous contrast. COMPARISON:  CT angio head and neck 01/17/2023 FINDINGS: Brain: Scattered foci of restricted diffusion are present in the cortical and subcortical white matter of the posterior left frontal and parietal lobe. Foci of restricted diffusion are present in the lateral aspect of left occipital lobe. T2 and FLAIR hyperintensities are associated with the areas of restricted diffusion. Minimal subcortical T2 hyperintensities otherwise are within normal limits for age. A remote lacunar infarct is present within the right lentiform nucleus. Deep brain nuclei are otherwise within normal limits. The ventricles are of normal size. No significant extraaxial fluid collection is present. The brainstem and cerebellum are within normal limits. The  internal auditory canals are within normal limits. Midline structures are within normal limits. Vascular: Flow is present in the major intracranial arteries. Skull and upper cervical spine: The craniocervical junction is normal. Upper cervical spine is within normal limits. Marrow signal is unremarkable.  Sinuses/Orbits: The paranasal sinuses and mastoid air cells are clear. The globes and orbits are within normal limits. IMPRESSION: 1. Scattered foci of restricted diffusion in the cortical and subcortical white matter of the posterior left frontal and parietal lobe as well as the lateral aspect of the left occipital lobe. 2. Remote lacunar infarct of the right lentiform nucleus. Electronically Signed   By: Marin Roberts M.D.   On: 01/18/2023 18:42    Vitals:   01/19/23 1220 01/19/23 1224 01/19/23 1225 01/19/23 1230  BP: (!) 176/109 (!) 185/102 (!) 185/102   Pulse: 66 62 61 61  Resp: (!) 24 17 14 14   Temp:   (!) 97.5 F (36.4 C)   TempSrc:   Temporal   SpO2: 95% 96% 96% 100%  Weight:      Height:         PHYSICAL EXAM General: Alert, well-nourished, well-developed patient in no acute distress Eyes: conjunctival hemorrhages bilaterally Psych: Mood "fine", affect: guarded, nervous. CV: Regular rate and rhythm on monitor Respiratory:  Regular, unlabored respirations on room air  NEURO:  Mental Status: AA&Ox3, patient is nervous and somewhat guarded.  Speech/Language: Slightly nonfluent speech with mild bradyphrenia Cranial Nerves:  II: PERRL. Visual fields show right homonymous hemianopsia III, IV, VI: R horizontal saccades when tracking vertically. Difficulty tracking past midline towards the right. Eyelids elevate symmetrically.  V: Sensation is intact to light touch and symmetrical to face.  VII: Face is symmetrical resting, mild R droop on smiling. VIII: hearing intact to voice. IX, X: Palate elevates symmetrically. Phonation is normal.  ON:GEXBMWUX shrug 5/5. XII: tongue is midline without fasciculations. Motor: 5/5 strength to all muscle groups tested except RUE. RUE 4+/5. Tone: is normal and bulk is normal Sensation- Intact to light touch bilaterally. Extinction absent to light touch to DSS.   Coordination: FTN intact bilaterally, HKS: no ataxia in BLE.No drift.  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 Recommend patient stay on at least monotherapy for anticoagulation until a definitive etiology of the embolic disease is found. TEE 10/21: Negative for cardiac source of embolism. No PFO or ASD seen by color Doppler. Agitated saline negative for intracardiac shunt, late positive bubble suggests extracardiac shunt  CTA head & neck Focal severe left M2 stenosis  MRI 10/20: IMPRESSION: 1. Scattered foci of restricted diffusion in the cortical and subcortical white matter of the posterior left frontal and parietal lobe as well as the lateral aspect of the left occipital lobe. 2. Remote lacunar infarct of the right lentiform nucleus. 2D Echo - 60 to 65%, severe L atrial dilation, dilatation of aorta, atrial shunting noted. 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 Therapy recommendations:  Pending Disposition:  Likely home, pending.   Hypertension Home meds:  Amlodipine 10, losartan 50,  Stable Blood Pressure Goal: BP less than 220/110   Hyperlipidemia LDL 125, goal < 70 Add Crestor 40mg   Continue statin at discharge  Substance Abuse UDS positive  for [facility] , counseling provided       Ready to quit? Yes TBD  Other Stroke Risk Factors ETOH use, alcohol level <10, advised to drink no more than 2 drink(s) a day Family hx stroke (Mother - acute MI in 60s, deceased) Migraines - [facility]. Recommend switching to tylenol only for migraines while on DAPT. Triptans can cause vasospasms.   Other Active Problems   Hospital day # 2  Signed: Christie Nottingham, MD PGY-1 [facility] Phys[facility] 01/19/2023 1:23  PM   STROKE MD NOTE :  Patient remains neurologically stable.  TEE shows no obvious cardiac source of embolism but presence of late bubbles suggests possibly intrapulmonary shunt and will obtain CT angiogram of the chest to look for pulmonary AVM.  Check lower extremity venous Dopplers for DVT.  Continue aspirin and Plavix for 3 weeks followed by aspirin alone and aggressive risk factor modification.  Greater than 50% time during this 35-minute visit was spent in counseling and coordination of care for his cryptogenic stroke and spoke about stroke evaluation and prevention and treatment and answering questions. Delia Heady, MD Medical Director [facility] Pager: [phone] 01/19/2023 1:23 PM   To contact Stroke Continuity provider, please refer to [email or web address]. After hours, contact General Neurology

## 2023-01-19 NOTE — Progress Notes (Signed)
Pt has been discharged home. IV has been removed. Catheter is intact. Tele removed. Pt and family have received discharge instructions and state understanding. Meds picked up from TOC. Pt has left floor accompanied by nursing staff.

## 2023-01-19 NOTE — Transfer of Care (Signed)
Immediate Anesthesia Transfer of Care Note  Patient: Richard Barrera  Procedure(s) Performed: TRANSESOPHAGEAL ECHOCARDIOGRAM  Patient Location: Cath Lab  Anesthesia Type:MAC  Level of Consciousness: drowsy and patient cooperative  Airway & Oxygen Therapy: Patient Spontanous Breathing and Patient connected to nasal cannula oxygen  Post-op Assessment: Report given to RN and Post -op Vital signs reviewed and stable  Post vital signs: Reviewed and stable  Last Vitals:  Vitals Value Taken Time  BP    Temp    Pulse    Resp    SpO2      Last Pain:  Vitals:   01/19/23 1030  TempSrc:   PainSc: 0-No pain         Complications: No notable events documented.

## 2023-01-19 NOTE — Progress Notes (Signed)
Patient went  back to ward.

## 2023-01-19 NOTE — TOC Transition Note (Signed)
Transition of Care [facility]) - CM/SW Discharge Note   Patient Details  Name: Richard Barrera MRN: [id number] Date of Birth: 01-Jan-1966  Transition of Care [facility]) CM/SW Contact:  Kermit Balo, RN Phone Number: 01/19/2023, 1:23 PM   Clinical Narrative:     Patient is discharging home with outpatient therapy [facility]. Referral sent and information on the AVS.  No DME needs.  Pt drives self normally but he states his sister can assist.  He lives alone but sister can check on him.  Sister will provide transportation home.   Final next level of care: OP Rehab Barriers to Discharge: No Barriers Identified   Patient Goals and CMS Choice   Choice offered to / list presented to : Patient  Discharge Placement                         Discharge Plan and Services Additional resources added to the After Visit Summary for                                       Social Determinants of Health (SDOH) Interventions SDOH Screenings   Social Connections: Unknown (08/12/2021)   Received from [facility]  Tobacco Use: Medium Risk (01/19/2023)     Readmission Risk Interventions     No data to display

## 2023-01-19 NOTE — Evaluation (Signed)
Occupational Therapy Evaluation Patient Details Name: Richard Barrera MRN: [id number] DOB: 03-09-1966 Today's Date: 01/19/2023   History of Present Illness 57 y.o. male who presents to ED 10/19 with right hand numbness, confusion, memory loss, aphasia, CT Head here concerning for a small L frontal lobe infarct. Unable to finish MRI due to claustrophobia. PMH: hypertension not on antihypertensives, history of polysubstance abuse, GERD   Clinical Impression   Richard Barrera was evaluated s/p the above admission list. He lives alone and is indep at baseline. Upon evaluation the pt was limited by impaired expressive communication, R weakness, poor R FM and dexterity. Overall he needed up to [facility] for transfers and mobility without AD. Due to the deficits listed below the pt also needs up to min A for ADLs due to RUE deficits. Pt will benefit from continued acute OT services and OP neuro OT.         If plan is discharge home, recommend the following: A little help with walking and/or transfers;A little help with bathing/dressing/bathroom;Assistance with cooking/housework;Direct supervision/assist for medications management;Direct supervision/assist for financial management;Assist for transportation    Functional Status Assessment  Patient has had a recent decline in their functional status and demonstrates the ability to make significant improvements in function in a reasonable and predictable amount of time.  Equipment Recommendations  None recommended by OT       Precautions / Restrictions Precautions Precautions: Fall Restrictions Weight Bearing Restrictions: No      Mobility Bed Mobility Overal bed mobility: Modified Independent                  Transfers Overall transfer level: Needs assistance Equipment used: None Transfers: Sit to/from Stand Sit to Stand: Contact guard assist                  Balance Overall balance assessment: Needs assistance Sitting-balance  support: Feet supported, No upper extremity supported Sitting balance-Leahy Scale: Good     Standing balance support: During functional activity, No upper extremity supported Standing balance-Leahy Scale: Fair                             ADL either performed or assessed with clinical judgement   ADL Overall ADL's : Needs assistance/impaired Eating/Feeding: NPO Eating/Feeding Details (indicate cue type and reason): TEE planned today Grooming: Minimal assistance   Upper Body Bathing: Set up;Sitting   Lower Body Bathing: Contact guard assist;Sit to/from stand   Upper Body Dressing : Set up;Sitting   Lower Body Dressing: Contact guard assist;Sit to/from stand   Toilet Transfer: Supervision/safety;Ambulation   Toileting- Clothing Manipulation and Hygiene: Supervision/safety;Sit to/from stand       Functional mobility during ADLs: Contact guard assist General ADL Comments: no AD, limited by R weakness and UE FM/GM deficits     Vision Baseline Vision/History: 0 No visual deficits Vision Assessment?: No apparent visual deficits Additional Comments: screen was [facility]     Perception Perception: Not tested       Praxis Praxis: Not tested       Pertinent Vitals/Pain Pain Assessment Pain Assessment: No/denies pain     Extremity/Trunk Assessment Upper Extremity Assessment Upper Extremity Assessment: Right hand dominant;RUE deficits/detail RUE Deficits / Details: AROM is WFL, globally 4-/5 strength, impaired gross and fine motor coordination, limited dexterity. Able to use functionally with increased time and effort RUE Sensation: decreased proprioception RUE Coordination: decreased fine motor;decreased gross motor   Lower Extremity Assessment Lower Extremity  Assessment: Defer to PT evaluation   Cervical / Trunk Assessment Cervical / Trunk Assessment: Kyphotic;Normal   Communication Communication Communication: Difficulty communicating thoughts/reduced  clarity of speech   Cognition Arousal: Alert Behavior During Therapy: WFL for tasks assessed/performed Overall Cognitive Status: Within Functional Limits for tasks assessed         General Comments: basic orientation WFL, speech improved this date and pt able to communicate needs and answer questions. Eager to go home with mildly impaired insight to deficits and safety     General Comments  VSS on RA            Home Living Family/patient expects to be discharged to:: Private residence Living Arrangements: Alone Available Help at Discharge: Family;Available PRN/intermittently Type of Home: House Home Access: Stairs to enter     Home Layout: One level     Bathroom Shower/Tub: Chief Strategy Officer: Standard     Home Equipment: None          Prior Functioning/Environment Prior Level of Function : Independent/Modified Independent                        OT Problem List: Decreased range of motion;Decreased activity tolerance;Impaired balance (sitting and/or standing);Decreased cognition;Decreased safety awareness      OT Treatment/Interventions: Self-care/ADL training;Neuromuscular education;Therapeutic activities;Patient/family education;Balance training    OT Goals(Current goals can be found in the care plan section) Acute Rehab OT Goals Patient Stated Goal: home asap OT Goal Formulation: With patient Time For Goal Achievement: 02/02/23 Potential to Achieve Goals: Good ADL Goals Pt Will Perform Grooming: with modified independence;standing Pt Will Transfer to Toilet: Independently;ambulating Additional ADL Goal #1: Pt will indep complete RUE FM HEP to increased functional indep for ADLs  OT Frequency: Min 1X/week       AM-PAC OT "6 Clicks" Daily Activity     Outcome Measure Help from another person eating meals?: Total Help from another person taking care of personal grooming?: A Little Help from another person toileting, which  includes using toliet, bedpan, or urinal?: A Little Help from another person bathing (including washing, rinsing, drying)?: A Little Help from another person to put on and taking off regular upper body clothing?: A Little Help from another person to put on and taking off regular lower body clothing?: A Little 6 Click Score: 16   End of Session Nurse Communication: Mobility status  Activity Tolerance: Patient tolerated treatment well Patient left: in bed;with call bell/phone within reach  OT Visit Diagnosis: Unsteadiness on feet (R26.81);Other abnormalities of gait and mobility (R26.89);Muscle weakness (generalized) (M62.81);Hemiplegia and hemiparesis Hemiplegia - Right/Left: Right Hemiplegia - dominant/non-dominant: Dominant Hemiplegia - caused by: Cerebral infarction                Time: 0900-0920 OT Time Calculation (min): 20 min Charges:  OT General Charges $OT Visit: 1 Visit OT Evaluation $OT Eval Moderate Complexity: 1 Mod  Derenda Mis, OTR/L Acute Rehabilitation Services Office [phone] Secure Chat Communication Preferred   Donia Pounds 01/19/2023, 9:31 AM

## 2023-01-20 ENCOUNTER — Other Ambulatory Visit: Payer: Self-pay | Admitting: Cardiology

## 2023-01-20 ENCOUNTER — Encounter (HOSPITAL_COMMUNITY): Payer: Self-pay | Admitting: Cardiology

## 2023-01-20 DIAGNOSIS — I639 Cerebral infarction, unspecified: Secondary | ICD-10-CM

## 2023-01-21 ENCOUNTER — Encounter: Payer: Self-pay | Admitting: *Deleted

## 2023-01-21 ENCOUNTER — Other Ambulatory Visit: Payer: Self-pay | Admitting: Cardiology

## 2023-01-21 DIAGNOSIS — R9431 Abnormal electrocardiogram [ECG] [EKG]: Secondary | ICD-10-CM

## 2023-01-21 DIAGNOSIS — I639 Cerebral infarction, unspecified: Secondary | ICD-10-CM

## 2023-01-21 NOTE — Progress Notes (Signed)
Patient enrolled for [employer]/ [employer] to ship a 30 day cardiac event monitor to his address on file.  Letter with instructions mailed to patient.  Dr. Bjorn Pippin to read.

## 2023-01-22 LAB — ECHO TEE

## 2023-02-02 ENCOUNTER — Other Ambulatory Visit (HOSPITAL_COMMUNITY): Payer: Self-pay

## 2023-02-04 ENCOUNTER — Encounter: Payer: Self-pay | Admitting: Physical Medicine and Rehabilitation

## 2023-02-09 ENCOUNTER — Ambulatory Visit: Payer: No Typology Code available for payment source | Admitting: Physical Therapy

## 2023-02-09 ENCOUNTER — Ambulatory Visit: Payer: No Typology Code available for payment source

## 2023-02-09 ENCOUNTER — Encounter: Payer: No Typology Code available for payment source | Admitting: Occupational Therapy

## 2023-03-15 NOTE — Progress Notes (Deleted)
Cardiology Office Note:    Date:  03/15/2023   ID:  Richard Barrera, DOB Apr 05, 1965, MRN [id number]  PCP:  [facility]  Cardiologist:  None  Electrophysiologist:  None   Referring MD: [facility]   No chief complaint on file. ***  History of Present Illness:    Richard Barrera is a 57 y.o. male with a hx of hypertension, polysubstance abuse, CVA who presents for evaluation of possible hypertrophic cardiomyopathy.  He was admitted 12/2022 with aphasia and right-sided numbness.  Found to have acute CVA.  TTE 01/18/2023 showed apical hypertrophy measuring 19 mm on apical short axis, EF 60 to 65%, normal RV function, severe left atrial enlargement; cardiac MRI recommended to evaluate for apical HCM.  TEE 01/19/2023 showed EF 60 to 65%, normal RV function, positive bubble study with late shunting consistent with intrapulmonary shunting.  He was discharged on 30-day monitor.  Past Medical History:  Diagnosis Date   Hypertension    Kidney stone     Past Surgical History:  Procedure Laterality Date   BACK SURGERY     TRANSESOPHAGEAL ECHOCARDIOGRAM (CATH LAB) N/A 01/19/2023   Procedure: TRANSESOPHAGEAL ECHOCARDIOGRAM;  Surgeon: Jodelle Red, MD;  Location: [facility] INVASIVE CV LAB;  Service: Cardiovascular;  Laterality: N/A;    Current Medications: No outpatient medications have been marked as taking for the 03/17/23 encounter (Appointment) with Little Ishikawa, MD.     Allergies:   Patient has no known allergies.   Social History   Socioeconomic History   Marital status: Single    Spouse name: Not on file   Number of children: Not on file   Years of education: Not on file   Highest education level: Not on file  Occupational History   Not on file  Tobacco Use   Smoking status: Former    Current packs/day: 0.00    Types: Cigarettes    Quit date: 03/31/2002    Years since quitting: 20.9   Smokeless tobacco: Never  Vaping Use   Vaping  status: Never Used  Substance and Sexual Activity   Alcohol use: Yes    Comment: Socially    Drug use: Yes    Types: Marijuana   Sexual activity: Not on file  Other Topics Concern   Not on file  Social History Narrative   Not on file   Social Drivers of Health   Financial Resource Strain: Not on file  Food Insecurity: Not on file  Transportation Needs: Not on file  Physical Activity: Not on file  Stress: Not on file  Social Connections: Unknown (08/12/2021)   Received from [facility]   Social Network    Social Network: Not on file     Family History: The patient's ***family history is not on file.  ROS:   Please see the history of present illness.    *** All other systems reviewed and are negative.  EKGs/Labs/Other Studies Reviewed:    The following studies were reviewed today: ***  EKG:  EKG is *** ordered today.  The ekg ordered today demonstrates ***  Recent Labs: 01/17/2023: ALT 12; BUN 10; Creatinine, Ser 1.09; Hemoglobin 17.8; Platelets 207; Potassium 3.8; Sodium 140; TSH 1.778  Recent Lipid Panel    Component Value Date/Time   CHOL 179 01/18/2023 0638   TRIG 91 01/18/2023 0638   HDL 36 (L) 01/18/2023 0638   CHOLHDL 5.0 01/18/2023 0638   VLDL 18 01/18/2023 0638   LDLCALC 125 (H) 01/18/2023 4010  Physical Exam:    VS:  There were no vitals taken for this visit.    Wt Readings from Last 3 Encounters:  01/17/23 185 lb (83.9 kg)  06/26/16 185 lb (83.9 kg)  12/31/15 190 lb (86.2 kg)     GEN: *** Well nourished, well developed in no acute distress HEENT: Normal NECK: No JVD; No carotid bruits LYMPHATICS: No lymphadenopathy CARDIAC: ***RRR, no murmurs, rubs, gallops RESPIRATORY:  Clear to auscultation without rales, wheezing or rhonchi  ABDOMEN: Soft, non-tender, non-distended MUSCULOSKELETAL:  No edema; No deformity  SKIN: Warm and dry NEUROLOGIC:  Alert and oriented x 3 PSYCHIATRIC:  Normal affect   ASSESSMENT:    No diagnosis  found. PLAN:    LVH: Severe apical hypertrophy on echocardiogram, cardiac MRI recommended to evaluate for apical HCM  Hypertension: On amlodipine 10 mg daily and losartan 50 mg daily  Acute CVA: Admitted with acute CVA 12/2022.  TEE showed no intracardiac source of embolism, late shunting on bubble study consistent with extracardiac shunt.  Continue aspirin, statin.  Discharged on 30-day monitor but was not returned***  Hyperlipidemia: On rosuvastatin 40 mg daily  RTC in***  Medication Adjustments/Labs and Tests Ordered: Current medicines are reviewed at length with the patient today.  Concerns regarding medicines are outlined above.  No orders of the defined types were placed in this encounter.  No orders of the defined types were placed in this encounter.   There are no Patient Instructions on file for this visit.   Signed, Little Ishikawa, MD  03/15/2023 2:39 PM    [facility]

## 2023-03-17 ENCOUNTER — Ambulatory Visit: Payer: No Typology Code available for payment source | Attending: Cardiology | Admitting: Cardiology

## 2023-04-06 NOTE — Therapy (Signed)
 Patient Name: Richard Barrera MRN: [id number] DOB:1966/03/08, 58 y.o., male Today's Date: 04/07/2023  Symptoms started 01/17/2023 with memory problems and slurred speech. Went to ED and had acute course until 01/19/23. Pt had right sided numbness/confusion and expressive aphasia. Pt is currently ambulating without any assistive device and driving. Pt denies any trouble with walking, balance, transfers or mobility. Pt denies very little issues with speech. Pt is alert and oriented x 3. Pt reports numbness and weakness in R arm which is his primary concern currently. We discussed that patient will most likely benefit from occupational therapy rather than physical therapy to work on UE coordination and weakness. Patient verbalized agreement. Pt will [facility] to [facility] Occupational therapy for him. Patient will not be charged for today's session.    Raj LOISE Blanch, PT 04/07/2023, 10:29 AM

## 2023-04-07 ENCOUNTER — Ambulatory Visit: Payer: No Typology Code available for payment source

## 2023-04-07 ENCOUNTER — Other Ambulatory Visit: Payer: Self-pay

## 2023-04-07 ENCOUNTER — Inpatient Hospital Stay: Payer: Self-pay | Admitting: Neurology

## 2023-04-07 DIAGNOSIS — R2689 Other abnormalities of gait and mobility: Secondary | ICD-10-CM

## 2023-04-07 DIAGNOSIS — M6281 Muscle weakness (generalized): Secondary | ICD-10-CM

## 2023-04-13 ENCOUNTER — Encounter: Payer: No Typology Code available for payment source | Admitting: Physical Medicine and Rehabilitation

## 2023-09-25 ENCOUNTER — Observation Stay (HOSPITAL_COMMUNITY)
Admission: EM | Admit: 2023-09-25 | Discharge: 2023-09-26 | Disposition: A | Discharge status: Home / Self Care | Attending: Family Medicine | Admitting: Family Medicine

## 2023-09-25 ENCOUNTER — Other Ambulatory Visit: Payer: Self-pay

## 2023-09-25 ENCOUNTER — Emergency Department (HOSPITAL_COMMUNITY)
Admission: EM | Admit: 2023-09-25 | Discharge: 2023-09-25 | Discharge status: Against Medical Advice | Source: Home / Self Care

## 2023-09-25 ENCOUNTER — Observation Stay (HOSPITAL_COMMUNITY)

## 2023-09-25 ENCOUNTER — Encounter (HOSPITAL_COMMUNITY): Payer: Self-pay | Admitting: Internal Medicine

## 2023-09-25 ENCOUNTER — Ambulatory Visit (HOSPITAL_COMMUNITY): Admission: EM | Admit: 2023-09-25 | Discharge: 2023-09-25 | Disposition: A | Discharge status: Home / Self Care

## 2023-09-25 ENCOUNTER — Emergency Department (HOSPITAL_COMMUNITY)

## 2023-09-25 ENCOUNTER — Encounter (HOSPITAL_COMMUNITY): Payer: Self-pay

## 2023-09-25 DIAGNOSIS — I10 Essential (primary) hypertension: Secondary | ICD-10-CM

## 2023-09-25 DIAGNOSIS — N289 Disorder of kidney and ureter, unspecified: Secondary | ICD-10-CM

## 2023-09-25 DIAGNOSIS — R112 Nausea with vomiting, unspecified: Secondary | ICD-10-CM | POA: Diagnosis not present

## 2023-09-25 DIAGNOSIS — E785 Hyperlipidemia, unspecified: Secondary | ICD-10-CM | POA: Insufficient documentation

## 2023-09-25 DIAGNOSIS — Z9189 Other specified personal risk factors, not elsewhere classified: Secondary | ICD-10-CM | POA: Diagnosis not present

## 2023-09-25 DIAGNOSIS — I5030 Unspecified diastolic (congestive) heart failure: Secondary | ICD-10-CM | POA: Insufficient documentation

## 2023-09-25 DIAGNOSIS — Z79899 Other long term (current) drug therapy: Secondary | ICD-10-CM | POA: Insufficient documentation

## 2023-09-25 DIAGNOSIS — Z5321 Procedure and treatment not carried out due to patient leaving prior to being seen by health care provider: Secondary | ICD-10-CM | POA: Diagnosis not present

## 2023-09-25 DIAGNOSIS — I110 Hypertensive heart disease with heart failure: Secondary | ICD-10-CM | POA: Diagnosis not present

## 2023-09-25 DIAGNOSIS — Z91148 Patient's other noncompliance with medication regimen for other reason: Secondary | ICD-10-CM | POA: Insufficient documentation

## 2023-09-25 DIAGNOSIS — R4701 Aphasia: Secondary | ICD-10-CM

## 2023-09-25 DIAGNOSIS — Z7901 Long term (current) use of anticoagulants: Secondary | ICD-10-CM | POA: Diagnosis not present

## 2023-09-25 DIAGNOSIS — I63512 Cerebral infarction due to unspecified occlusion or stenosis of left middle cerebral artery: Secondary | ICD-10-CM | POA: Diagnosis not present

## 2023-09-25 DIAGNOSIS — Z7982 Long term (current) use of aspirin: Secondary | ICD-10-CM | POA: Diagnosis not present

## 2023-09-25 DIAGNOSIS — Z8659 Personal history of other mental and behavioral disorders: Secondary | ICD-10-CM | POA: Insufficient documentation

## 2023-09-25 DIAGNOSIS — G9340 Encephalopathy, unspecified: Secondary | ICD-10-CM

## 2023-09-25 DIAGNOSIS — Z87891 Personal history of nicotine dependence: Secondary | ICD-10-CM | POA: Diagnosis not present

## 2023-09-25 DIAGNOSIS — I639 Cerebral infarction, unspecified: Principal | ICD-10-CM

## 2023-09-25 DIAGNOSIS — I5032 Chronic diastolic (congestive) heart failure: Secondary | ICD-10-CM | POA: Diagnosis not present

## 2023-09-25 DIAGNOSIS — F802 Mixed receptive-expressive language disorder: Secondary | ICD-10-CM | POA: Diagnosis not present

## 2023-09-25 DIAGNOSIS — Z7902 Long term (current) use of antithrombotics/antiplatelets: Secondary | ICD-10-CM | POA: Diagnosis not present

## 2023-09-25 DIAGNOSIS — N2889 Other specified disorders of kidney and ureter: Secondary | ICD-10-CM | POA: Insufficient documentation

## 2023-09-25 LAB — I-STAT CHEM 8, ED
BUN: 14 mg/dL (ref 6–20)
Calcium, Ion: 1.17 mmol/L (ref 1.15–1.40)
Chloride: 103 mmol/L (ref 98–111)
Creatinine, Ser: 0.9 mg/dL (ref 0.61–1.24)
Glucose, Bld: 95 mg/dL (ref 70–99)
HCT: 55 % — ABNORMAL HIGH (ref 39.0–52.0)
Hemoglobin: 18.7 g/dL — ABNORMAL HIGH (ref 13.0–17.0)
Potassium: 3.7 mmol/L (ref 3.5–5.1)
Sodium: 138 mmol/L (ref 135–145)
TCO2: 22 mmol/L (ref 22–32)

## 2023-09-25 LAB — BASIC METABOLIC PANEL WITH GFR
Anion gap: 12 (ref 5–15)
BUN: 10 mg/dL (ref 6–20)
CO2: 22 mmol/L (ref 22–32)
Calcium: 9.8 mg/dL (ref 8.9–10.3)
Chloride: 103 mmol/L (ref 98–111)
Creatinine, Ser: 1.15 mg/dL (ref 0.61–1.24)
GFR, Estimated: >60 mL/min (ref >60)
Glucose, Bld: 96 mg/dL (ref 70–99)
Potassium: 3.8 mmol/L (ref 3.5–5.1)
Sodium: 137 mmol/L (ref 135–145)

## 2023-09-25 LAB — DIFFERENTIAL
Abs Immature Granulocytes: 0.01 10*3/uL (ref 0.00–0.07)
Basophils Absolute: 0 10*3/uL (ref 0.0–0.1)
Basophils Relative: 1 %
Eosinophils Absolute: 0 10*3/uL (ref 0.0–0.5)
Eosinophils Relative: 1 %
Immature Granulocytes: 0 %
Lymphocytes Relative: 24 %
Lymphs Abs: 1.2 10*3/uL (ref 0.7–4.0)
Monocytes Absolute: 0.5 10*3/uL (ref 0.1–1.0)
Monocytes Relative: 9 %
Neutro Abs: 3.2 10*3/uL (ref 1.7–7.7)
Neutrophils Relative %: 65 %

## 2023-09-25 LAB — CBC
HCT: 54.5 % — ABNORMAL HIGH (ref 39.0–52.0)
HCT: 53.5 % — ABNORMAL HIGH (ref 39.0–52.0)
Hemoglobin: 18.1 g/dL — ABNORMAL HIGH (ref 13.0–17.0)
Hemoglobin: 17.9 g/dL — ABNORMAL HIGH (ref 13.0–17.0)
MCH: 29.9 pg (ref 26.0–34.0)
MCH: 29.7 pg (ref 26.0–34.0)
MCHC: 33.2 g/dL (ref 30.0–36.0)
MCHC: 33.5 g/dL (ref 30.0–36.0)
MCV: 90.1 fL (ref 80.0–100.0)
MCV: 88.9 fL (ref 80.0–100.0)
Platelets: 206 10*3/uL (ref 150–400)
Platelets: 213 10*3/uL (ref 150–400)
RBC: 6.05 MIL/uL — ABNORMAL HIGH (ref 4.22–5.81)
RBC: 6.02 MIL/uL — ABNORMAL HIGH (ref 4.22–5.81)
RDW: 15.4 % (ref 11.5–15.5)
RDW: 15.1 % (ref 11.5–15.5)
WBC: 5.2 10*3/uL (ref 4.0–10.5)
WBC: 4.9 10*3/uL (ref 4.0–10.5)
nRBC: 0 % (ref 0.0–0.2)
nRBC: 0 % (ref 0.0–0.2)

## 2023-09-25 LAB — COMPREHENSIVE METABOLIC PANEL WITH GFR
ALT: 15 U/L (ref 0–44)
AST: 22 U/L (ref 15–41)
Albumin: 4.1 g/dL (ref 3.5–5.0)
Alkaline Phosphatase: 55 U/L (ref 38–126)
Anion gap: 14 (ref 5–15)
BUN: 11 mg/dL (ref 6–20)
CO2: 21 mmol/L — ABNORMAL LOW (ref 22–32)
Calcium: 9.8 mg/dL (ref 8.9–10.3)
Chloride: 101 mmol/L (ref 98–111)
Creatinine, Ser: 1.05 mg/dL (ref 0.61–1.24)
GFR, Estimated: >60 mL/min (ref >60)
Glucose, Bld: 89 mg/dL (ref 70–99)
Potassium: 3.7 mmol/L (ref 3.5–5.1)
Sodium: 136 mmol/L (ref 135–145)
Total Bilirubin: 1.3 mg/dL — ABNORMAL HIGH (ref 0.0–1.2)
Total Protein: 6.5 g/dL (ref 6.5–8.1)

## 2023-09-25 LAB — APTT: aPTT: 28 s (ref 24–36)

## 2023-09-25 LAB — PROTIME-INR
INR: 1 (ref 0.8–1.2)
Prothrombin Time: 13.3 s (ref 11.4–15.2)

## 2023-09-25 LAB — TROPONIN I (HIGH SENSITIVITY)
Troponin I (High Sensitivity): 10 ng/L (ref <18)
Troponin I (High Sensitivity): 15 ng/L (ref <18)

## 2023-09-25 LAB — HEMOGLOBIN A1C
Hgb A1c MFr Bld: 4.8 % (ref 4.8–5.6)
Mean Plasma Glucose: 91.06 mg/dL

## 2023-09-25 LAB — ETHANOL: Alcohol, Ethyl (B): <15 mg/dL (ref <15)

## 2023-09-25 MED ORDER — LORAZEPAM 2 MG/ML IJ SOLN
1.0000 mg | Freq: Once | INTRAMUSCULAR | Status: AC | PRN
  Administered 2023-09-25: 1 mg via INTRAVENOUS
  Filled 2023-09-25: qty 1

## 2023-09-25 MED ORDER — ROSUVASTATIN CALCIUM 20 MG PO TABS
40.0000 mg | ORAL_TABLET | Freq: Every day | ORAL | Status: DC
  Administered 2023-09-26: 40 mg via ORAL
  Filled 2023-09-25: qty 2

## 2023-09-25 MED ORDER — ASPIRIN 81 MG PO CHEW: 81.0000 mg | CHEWABLE_TABLET | Freq: Every day | ORAL | Status: DC

## 2023-09-25 MED ORDER — SODIUM CHLORIDE 0.9% FLUSH
3.0000 mL | Freq: Two times a day (BID) | INTRAVENOUS | Status: DC
  Administered 2023-09-25: 10 mL via INTRAVENOUS
  Administered 2023-09-26: 3 mL via INTRAVENOUS

## 2023-09-25 MED ORDER — ACETAMINOPHEN 325 MG PO TABS: 650.0000 mg | ORAL_TABLET | ORAL | Status: DC | PRN

## 2023-09-25 MED ORDER — SODIUM CHLORIDE 0.9% FLUSH: 3.0000 mL | INTRAVENOUS | Status: DC | PRN

## 2023-09-25 MED ORDER — SENNOSIDES-DOCUSATE SODIUM 8.6-50 MG PO TABS: 1.0000 | ORAL_TABLET | Freq: Every evening | ORAL | Status: DC | PRN

## 2023-09-25 MED ORDER — IOHEXOL 350 MG/ML SOLN
75.0000 mL | Freq: Once | INTRAVENOUS | Status: AC | PRN
  Administered 2023-09-25: 75 mL via INTRAVENOUS

## 2023-09-25 MED ORDER — ONDANSETRON 4 MG PO TBDP
4.0000 mg | ORAL_TABLET | Freq: Once | ORAL | Status: AC
  Administered 2023-09-25: 4 mg via ORAL
  Filled 2023-09-25: qty 1

## 2023-09-25 MED ORDER — STROKE: EARLY STAGES OF RECOVERY BOOK
Freq: Once | Status: AC
  Filled 2023-09-25: qty 1

## 2023-09-25 MED ORDER — CLOPIDOGREL BISULFATE 300 MG PO TABS
300.0000 mg | ORAL_TABLET | Freq: Once | ORAL | Status: AC
  Administered 2023-09-25: 300 mg via ORAL
  Filled 2023-09-25: qty 1

## 2023-09-25 MED ORDER — ASPIRIN 325 MG PO TABS
325.0000 mg | ORAL_TABLET | Freq: Once | ORAL | Status: AC
  Administered 2023-09-25: 325 mg via ORAL
  Filled 2023-09-25: qty 1

## 2023-09-25 MED ORDER — FAMOTIDINE 20 MG PO TABS
20.0000 mg | ORAL_TABLET | Freq: Two times a day (BID) | ORAL | Status: DC
  Administered 2023-09-25 – 2023-09-26 (×2): 20 mg via ORAL
  Filled 2023-09-25 (×3): qty 1

## 2023-09-25 MED ORDER — ASPIRIN 81 MG PO TBEC
81.0000 mg | DELAYED_RELEASE_TABLET | Freq: Every day | ORAL | Status: DC
  Administered 2023-09-26: 81 mg via ORAL
  Filled 2023-09-25: qty 1

## 2023-09-25 MED ORDER — ENOXAPARIN SODIUM 40 MG/0.4ML IJ SOSY
40.0000 mg | PREFILLED_SYRINGE | INTRAMUSCULAR | Status: DC
  Administered 2023-09-25: 40 mg via SUBCUTANEOUS
  Filled 2023-09-25: qty 0.4

## 2023-09-25 MED ORDER — CLOPIDOGREL BISULFATE 75 MG PO TABS: 75.0000 mg | ORAL_TABLET | Freq: Every day | ORAL | Status: DC

## 2023-09-25 MED ORDER — ACETAMINOPHEN 650 MG RE SUPP: 650.0000 mg | RECTAL | Status: DC | PRN

## 2023-09-25 MED ORDER — HYDRALAZINE HCL 20 MG/ML IJ SOLN: 10.0000 mg | Freq: Four times a day (QID) | INTRAMUSCULAR | Status: DC | PRN

## 2023-09-25 MED ORDER — ACETAMINOPHEN 160 MG/5ML PO SOLN: 650.0000 mg | ORAL | Status: DC | PRN

## 2023-09-25 NOTE — ED Provider Notes (Signed)
 [facility]    CSN: [id number] Arrival date & time: 09/25/23  0803      History   Chief Complaint Chief Complaint  Patient presents with   Altered Mental Status    HPI Richard Barrera is a 58 y.o. male.   HPI Patient was seen in the Urgent Care to initiate workup and assess the appropriateness for care in the Urgent Care environment. A brief history, physical exam and plan is outlined below.  HPI: In brief, patient presents with his sister with concern of confusion and difficulty speaking.  They report that his symptoms began about 2 or 3 days ago.  Over the past day now, he has been vomiting as well.  He denies any slurred speech but is having difficulty getting out the speech that he is intending.  He denies any vision changes, weakness, facial drooping, loss of consciousness, or other concerns at this time.  He reports that over the past 2 days, he has not taken any of his normal medications but has been consistent with them before that.  He does report having a stroke last year.   Past Medical History:  Diagnosis Date   Hypertension    Kidney stone     Patient Active Problem List   Diagnosis Date Noted   CVA (cerebral vascular accident) (HCC) 01/18/2023   HTN (hypertension) 01/18/2023   Polysubstance abuse (HCC) 01/18/2023   Encephalopathy 01/17/2023    Past Surgical History:  Procedure Laterality Date   BACK SURGERY     TRANSESOPHAGEAL ECHOCARDIOGRAM (CATH LAB) N/A 01/19/2023   Procedure: TRANSESOPHAGEAL ECHOCARDIOGRAM;  Surgeon: Lonni Slain, MD;  Location: [facility] INVASIVE CV LAB;  Service: Cardiovascular;  Laterality: N/A;       Home Medications    Prior to Admission medications   Medication Sig Start Date End Date Taking? Authorizing Provider  amitriptyline (ELAVIL) 25 MG tablet Take 12.5 mg by mouth 2 (two) times daily.    [provider]  amLODipine  (NORVASC ) 10 MG tablet Take 1 tablet (10 mg total) by mouth daily. 01/19/23    Jillian Buttery, MD  aspirin  81 MG chewable tablet Chew 1 tablet (81 mg total) by mouth daily. 01/20/23   Jillian Buttery, MD  famotidine  (PEPCID ) 20 MG tablet Take 1 tablet (20 mg total) by mouth 2 (two) times daily. 12/28/20   Kabbe, Angela M, NP  fluticasone  (FLONASE ) 50 MCG/ACT nasal spray Place 2 sprays into both nostrils daily. Patient taking differently: Place 2 sprays into both nostrils 2 (two) times daily as needed for allergies. 05/14/21   Graham, Laura E, PA-C  gabapentin (NEURONTIN) 100 MG capsule Take 100 mg by mouth 3 (three) times daily. 01/09/23   [provider]  loratadine (CLARITIN) 10 MG tablet Take 10 mg by mouth daily. 08/10/22   [provider]  losartan  (COZAAR ) 50 MG tablet Take 1 tablet (50 mg total) by mouth daily. 01/19/23   Adhikari, Amrit, MD  mirtazapine (REMERON) 7.5 MG tablet Take 7.5 mg by mouth at bedtime as needed (for pain/sleep).    [provider]  ondansetron  (ZOFRAN -ODT) 4 MG disintegrating tablet Take 1 tablet (4 mg total) by mouth every 8 (eight) hours as needed for nausea or vomiting. 01/12/23   Vonna Sharlet POUR, MD  pantoprazole  (PROTONIX ) 20 MG tablet Take 1 tablet (20 mg total) by mouth daily. 01/19/23   Adhikari, Amrit, MD  promethazine (PHENERGAN) 25 MG tablet Take 25 mg by mouth 3 (three) times daily as needed for nausea or  vomiting. 01/13/23   [provider]  rosuvastatin  (CRESTOR ) 40 MG tablet Take 1 tablet (40 mg total) by mouth daily. 01/20/23   Adhikari, Amrit, MD  sildenafil (VIAGRA) 100 MG tablet Take 100 mg by mouth daily as needed for erectile dysfunction.    [provider]  SUMAtriptan (IMITREX) 50 MG tablet Take 50 mg by mouth every 2 (two) hours as needed for migraine. 01/13/23   [provider]    Family History No family history on file.  Social History Social History   Tobacco Use   Smoking status: Former    Current packs/day: 0.00    Types: Cigarettes    Quit date:  03/31/2002    Years since quitting: 21.5   Smokeless tobacco: Never  Vaping Use   Vaping status: Never Used  Substance Use Topics   Alcohol use: Yes    Comment: Socially    Drug use: Yes    Types: Marijuana     Allergies   Patient has no known allergies.   Review of Systems Review of Systems See HPI for relevant ROS.  Physical Exam Triage Vital Signs ED Triage Vitals [09/25/23 0814]  Encounter Vitals Group     BP (!) 167/130     Girls Systolic BP Percentile      Girls Diastolic BP Percentile      Boys Systolic BP Percentile      Boys Diastolic BP Percentile      Pulse Rate (!) 54     Resp 18     Temp 98.5 F (36.9 C)     Temp Source Oral     SpO2 94 %     Weight      Height      Head Circumference      Peak Flow      Pain Score      Pain Loc      Pain Education      Exclude from Growth Chart    No data found.  Updated Vital Signs BP (!) 167/130 (BP Location: Right Arm)   Pulse (!) 54   Temp 98.5 F (36.9 C) (Oral)   Resp 18   SpO2 94%   Visual Acuity Right Eye Distance:   Left Eye Distance:   Bilateral Distance:    Right Eye Near:   Left Eye Near:    Bilateral Near:     Physical Exam General: Alert and oriented, well-developed/well-nourished, calm, cooperative, no acute distress HEENT: Normocephalic atraumatic, moist mucous membranes, no scleral icterus, trachea midline Lungs: Non-labored respirations, no distress Musculoskeletal: Moves all extremities well, full and equal grip strength bilaterally Neurologic: Awake, A&O x4, no facial drooping or slurred speech, no pronator drift, gait normal Psychiatric: Appropriate mood & affect  UC Treatments / Results  Labs (all labs ordered are listed, but only abnormal results are displayed) Labs Reviewed - No data to display  EKG   Radiology No results found.  Procedures Procedures (including critical care time)  Medications Ordered in UC Medications - No data to display  Initial Impression  / Assessment and Plan / UC Course  I have reviewed the triage vital signs and the nursing notes.  Pertinent labs & imaging results that were available during my care of the patient were reviewed by me and considered in my medical decision making (see chart for details).  Plan: At this time, I feel the patients presenting complaint, constellation of symptoms, physical exam, and potential differential diagnosis (which includes but is  not limited to: Altered mental status, stroke, TIA, glucose abnormality, electrolyte abnormality) exceeds the scope of care for this Urgent Cares available resources. Subsequently, we offered EMS transportation to an appropriate facility and the patient and family declined.  Patient's symptoms started 3 days ago, patient is currently stable, and in the emergency department is directly concentrated so they were informed to go directly there and do not stop on the way.  Final Clinical Impressions(s) / UC Diagnoses   Final diagnoses:  Aphasia     Discharge Instructions      Please go to the emergency department immediately for further evaluation.     ED Prescriptions   None    PDMP not reviewed this encounter.   Melonie Locus, PA-C 09/25/23 [phone]

## 2023-09-25 NOTE — Discharge Instructions (Addendum)
Please go to the emergency department immediately for further evaluation

## 2023-09-25 NOTE — ED Notes (Signed)
 Limited intake d/t sx's and need to go to ED.

## 2023-09-25 NOTE — ED Triage Notes (Signed)
 Patient BIB sister from UC for eval for stroke. Patient is aphasic and slightly dysarthric x 2 days, since Wednesday. Patient able to deny unilateral deficit, dizziness, and headache. Patient is A&Ox4 and ambulatory with a steady gait.

## 2023-09-25 NOTE — H&P (Addendum)
 History and Physical    Richard Barrera FMW:[id number] DOB: [birth year] DOA: 09/25/2023  PCP: [facility]   Patient coming from: Home   Chief Complaint:  Chief Complaint  Patient presents with   Aphasia   ED TRIAGE note:  Per sister, pt has been confused and unable to get his words out x3 days. States hx of stroke 12/2022. Pt states hasn't taken his meds in past 2 days. Provider at bedside. No facial droop or drift noted. Pt states his speech is off unable to say what he wants too.            HPI:  Richard Barrera is a 58 y.o. male with medical history significant of history of CVA 01/18/2023, HFpEF 60 to 65%, essential hypertension and polysubstance use presents emergency department complaining of unable to speech/difficulty speaking and word finding.  Patient reported that he has been not taking any medication for last couple of days.  Noticed that he is having difficulty since Wednesday 09/23/23.  Patient's family member at the bedside reported that since Wednesday patient has has difficult to to find his words and expresses words and emotion as well.  Patient lives by himself and sister lives next door.  She noticed that he has not been eating and drinking well probably from Wednesday and Thursday she went home to check on him noticed that he continues to have word finding difficulty, getting anxious very quickly and agitated and had few episodes of vomiting.  He was also crying in anxiety given he was not able to finding his words.  During my evaluation at the bedside patient continues to have expressive aphasia.  It takes a longer time to process the question and answering the questions as well.  Patient gets agitated very quickly and started cursing towards me.  He does not really wants to be stayed in the hospital for further workup however sister at the bedside convinced him that he need imaging of his neck and ultrasound of his heart to find out the cause of the stroke.   Patient does not have any upper and lower extremity weakness and sensory deficit.  Based on my evaluation at the bedside it seems like that he does not have any swallowing problem however will obtain stroke swallow screen. Patient is also alert oriented x 2.  He was able to state his name, date of birth, his sister name currently at the bedside however he mentioned that he is at home and surrounding seems like not the hospital  Sister at the bedside reported that she makes the pillbox for the patient and she has been noticed few medication left in the pillbox out of 3 to 4 days.   Patient denies any headache, blurry vision, chest pain, palpitation, dizziness, tingling, tremor, sensory deficit, generalized weakness, focal weakness, seizure, fall, and loss of consciousness.  ED Course:  At presentation to ED patient found of elevated blood pressure 188/119 otherwise hemodynamically stable. BMP unremarkable. CBC unremarkable. Troponin x 2 within normal range. Normal pro time INR. Blood alcohol of less than 15. Pending UDS. EKG normal sinus rhythm heart rate 52, biatrial enlargement, right ventricular hypertrophy pattern.   ED physician initially consulted neurology who recommended if MRI shows any evidence of a stroke need to admit for further stroke workup.  MRI result official reading is pending by radiology however it is showing evidence of acute stroke and ED physician informed neurology Dr. Deedra recommended need to admit for further workup.  Hospitalist has  been consulted for further evaluation management of acute CVA.  Significant labs in the ED: Lab Orders         Protime-INR         APTT         CBC         Differential         Comprehensive metabolic panel         Ethanol         Urine rapid drug screen (hosp performed)         Urinalysis, w/ Reflex to Culture (Infection Suspected) -Urine, Unspecified Source         Lipid panel         Hemoglobin A1c         CBC          Basic metabolic panel         I-stat chem 8, ED         CBG monitoring, ED       Review of Systems:  Review of Systems  Constitutional:  Negative for chills, fever, malaise/fatigue and weight loss.  Respiratory:  Negative for cough, shortness of breath and wheezing.   Cardiovascular:  Negative for chest pain, palpitations, orthopnea and leg swelling.  Gastrointestinal:  Negative for abdominal pain, diarrhea, heartburn, nausea and vomiting.  Neurological:  Positive for speech change. Negative for dizziness, tingling, tremors, sensory change, focal weakness, seizures, loss of consciousness, weakness and headaches.  Psychiatric/Behavioral:  The patient is nervous/anxious.     Past Medical History:  Diagnosis Date   Hypertension    Kidney stone     Past Surgical History:  Procedure Laterality Date   BACK SURGERY     TRANSESOPHAGEAL ECHOCARDIOGRAM (CATH LAB) N/A 01/19/2023   Procedure: TRANSESOPHAGEAL ECHOCARDIOGRAM;  Surgeon: Lonni Slain, MD;  Location: [facility] INVASIVE CV LAB;  Service: Cardiovascular;  Laterality: N/A;     reports that he quit smoking about 21 years ago. His smoking use included cigarettes. He has never used smokeless tobacco. He reports current alcohol use. He reports current drug use. Drug: Marijuana.  No Known Allergies  History reviewed. No pertinent family history.  Prior to Admission medications   Medication Sig Start Date End Date Taking? Authorizing Provider  amitriptyline (ELAVIL) 25 MG tablet Take 12.5 mg by mouth 2 (two) times daily.    [provider]  amLODipine  (NORVASC ) 10 MG tablet Take 1 tablet (10 mg total) by mouth daily. 01/19/23   Jillian Buttery, MD  aspirin  81 MG chewable tablet Chew 1 tablet (81 mg total) by mouth daily. 01/20/23   Jillian Buttery, MD  famotidine  (PEPCID ) 20 MG tablet Take 1 tablet (20 mg total) by mouth 2 (two) times daily. 12/28/20   Kabbe, Angela M, NP  fluticasone  (FLONASE ) 50 MCG/ACT nasal spray Place  2 sprays into both nostrils daily. Patient taking differently: Place 2 sprays into both nostrils 2 (two) times daily as needed for allergies. 05/14/21   Graham, Laura E, PA-C  gabapentin (NEURONTIN) 100 MG capsule Take 100 mg by mouth 3 (three) times daily. 01/09/23   [provider]  loratadine (CLARITIN) 10 MG tablet Take 10 mg by mouth daily. 08/10/22   [provider]  losartan  (COZAAR ) 50 MG tablet Take 1 tablet (50 mg total) by mouth daily. 01/19/23   Adhikari, Amrit, MD  mirtazapine (REMERON) 7.5 MG tablet Take 7.5 mg by mouth at bedtime as needed (for pain/sleep).    [provider]  ondansetron  (ZOFRAN -ODT)  4 MG disintegrating tablet Take 1 tablet (4 mg total) by mouth every 8 (eight) hours as needed for nausea or vomiting. 01/12/23   Vonna Sharlet POUR, MD  pantoprazole  (PROTONIX ) 20 MG tablet Take 1 tablet (20 mg total) by mouth daily. 01/19/23   Adhikari, Amrit, MD  promethazine (PHENERGAN) 25 MG tablet Take 25 mg by mouth 3 (three) times daily as needed for nausea or vomiting. 01/13/23   [provider]  rosuvastatin  (CRESTOR ) 40 MG tablet Take 1 tablet (40 mg total) by mouth daily. 01/20/23   Adhikari, Amrit, MD  sildenafil (VIAGRA) 100 MG tablet Take 100 mg by mouth daily as needed for erectile dysfunction.    [provider]  SUMAtriptan (IMITREX) 50 MG tablet Take 50 mg by mouth every 2 (two) hours as needed for migraine. 01/13/23   [provider]     Physical Exam: Vitals:   09/25/23 1449 09/25/23 1600  BP: (!) 188/119 (!) 148/91  Pulse: 60 (!) 55  Resp: 20 18  Temp: 97.7 F (36.5 C)   TempSrc: Axillary   SpO2: 99% 100%    Physical Exam Vitals and nursing note reviewed.  Constitutional:      General: He is not in acute distress.    Appearance: He is not ill-appearing.   Cardiovascular:     Rate and Rhythm: Normal rate and regular rhythm.     Pulses: Normal pulses.     Heart sounds: Normal heart sounds.   Pulmonary:     Effort: Pulmonary effort is normal.     Breath sounds: Normal breath sounds.  Abdominal:     Palpations: Abdomen is soft.   Musculoskeletal:     Cervical back: Neck supple.     Right lower leg: No edema.     Left lower leg: No edema.   Skin:    General: Skin is dry.     Capillary Refill: Capillary refill takes less than 2 seconds.   Neurological:     Mental Status: He is alert. He is disoriented.     Cranial Nerves: Cranial nerve deficit present.     Sensory: No sensory deficit.     Motor: No weakness.   Psychiatric:        Attention and Perception: Attention normal.        Mood and Affect: Mood is elated. Affect is angry.        Speech: Speech is delayed. Speech is not slurred or tangential.        Behavior: Behavior is agitated and slowed. Behavior is not aggressive, withdrawn, hyperactive or combative. Behavior is cooperative.        Thought Content: Thought content normal.        Cognition and Memory: Cognition is impaired. Memory is impaired.        Judgment: Judgment normal.      Labs on Admission: I have personally reviewed following labs and imaging studies  CBC: Recent Labs  Lab 09/25/23 0856 09/25/23 1457 09/25/23 1611  WBC 5.2 4.9  --   NEUTROABS  --  3.2  --   HGB 18.1* 17.9* 18.7*  HCT 54.5* 53.5* 55.0*  MCV 90.1 88.9  --   PLT 206 213  --    Basic Metabolic Panel: Recent Labs  Lab 09/25/23 0856 09/25/23 1457 09/25/23 1611  NA 137 136 138  K 3.8 3.7 3.7  CL 103 101 103  CO2 22 21*  --   GLUCOSE 96 89 95  BUN 10 11  14  CREATININE 1.15 1.05 0.90  CALCIUM  9.8 9.8  --    GFR: Estimated Creatinine Clearance: 103.6 mL/min (by C-G formula based on SCr of 0.9 mg/dL). Liver Function Tests: Recent Labs  Lab 09/25/23 1457  AST 22  ALT 15  ALKPHOS 55  BILITOT 1.3*  PROT 6.5  ALBUMIN 4.1   No results for input(s): LIPASE, AMYLASE in the last 168 hours. No results for input(s): AMMONIA in the last 168  hours. Coagulation Profile: Recent Labs  Lab 09/25/23 1457  INR 1.0   Cardiac Enzymes: Recent Labs  Lab 09/25/23 0856 09/25/23 1051  TROPONINIHS 10 15   BNP (last 3 results) No results for input(s): BNP in the last 8760 hours. HbA1C: No results for input(s): HGBA1C in the last 72 hours. CBG: No results for input(s): GLUCAP in the last 168 hours. Lipid Profile: No results for input(s): CHOL, HDL, LDLCALC, TRIG, CHOLHDL, LDLDIRECT in the last 72 hours. Thyroid  Function Tests: No results for input(s): TSH, T4TOTAL, FREET4, T3FREE, THYROIDAB in the last 72 hours. Anemia Panel: No results for input(s): VITAMINB12, FOLATE, FERRITIN, TIBC, IRON, RETICCTPCT in the last 72 hours. Urine analysis:    Component Value Date/Time   COLORURINE YELLOW 04/14/2010 0644   APPEARANCEUR CLEAR 04/14/2010 0644   LABSPEC 1.018 04/14/2010 0644   PHURINE 6.5 04/14/2010 0644   GLUCOSEU NEGATIVE 02/01/2010 0538   HGBUR NEGATIVE 04/14/2010 0644   BILIRUBINUR NEGATIVE 04/14/2010 0644   KETONESUR NEGATIVE 04/14/2010 0644   PROTEINUR NEGATIVE 04/14/2010 0644   UROBILINOGEN 1.0 04/14/2010 0644   NITRITE NEGATIVE 04/14/2010 0644   LEUKOCYTESUR  04/14/2010 0644    NEGATIVE MICROSCOPIC NOT DONE ON URINES WITH NEGATIVE PROTEIN, BLOOD, LEUKOCYTES, NITRITE, OR GLUCOSE <1000 mg/dL.    Radiological Exams on Admission: I have personally reviewed images MR BRAIN WO CONTRAST Result Date: 09/25/2023 CLINICAL DATA:  Provided history: Neuro deficit, acute, stroke suspected. EXAM: MRI HEAD WITHOUT CONTRAST TECHNIQUE: Multiplanar, multiecho pulse sequences of the brain and surrounding structures were obtained without intravenous contrast. COMPARISON:  Brain MRI 01/18/2023. FINDINGS: Intermittently motion degraded examination. Most notably, there is moderate-to-severe motion degradation of the axial T2 sequence. Within this limitation, findings are as follows. Brain: No  age-advanced or lobar predominant cerebral atrophy. Moderate-sized acute left MCA territory cortical/subcortical infarct within the insula and adjacent temporal lobe. Additional small patchy acute cortical and subcortical left MCA territory infarcts at the temporoparietal junction, within the parietal lobe and within the lateral occipital lobe. Known chronic cortical/subcortical left MCA territory infarcts within the frontal, parietal and occipital lobes, some of which were better appreciated on the prior brain MRI of 01/18/2023 (acute at that time). Unchanged chronic lacunar infarct within the right lentiform nucleus. Background mild multifocal T2 FLAIR hyperintense signal abnormality within the cerebral white matter, nonspecific but compatible chronic small vessel ischemic disease. No evidence of an intracranial mass. No chronic intracranial blood products. No extra-axial fluid collection. No midline shift. Vascular: No definite loss of proximal large arterial flow voids. Skull and upper cervical spine: No focal worrisome marrow lesion. Sinuses/Orbits: No mass or acute finding within the imaged orbits. Small mucous retention cysts within the right maxillary sinus. Mild mucosal thickening versus small mucous retention cyst within a right ethmoid air cell posteriorly. Other: Small-volume fluid within left mastoid air cells. IMPRESSION: 1. Motion degraded examination. 2. Moderate-sized acute left MCA territory cortical/subcortical infarct within the insula and adjacent temporal lobe. Additional small patchy acute cortical and subcortical left MCA territory infarcts as described. 3. Background parenchymal  atrophy, chronic small vessel ischemic disease and chronic infarcts. 4. Paranasal sinus disease as described. 5. Small left mastoid effusion. Electronically Signed   By: Rockey Childs D.O.   On: 09/25/2023 19:13     EKG: My personal interpretation of EKG shows: Normal sinus rhythm heart rate  52    Assessment/Plan: Principal Problem:   Acute CVA (cerebrovascular accident) [facility]) Active Problems:   Combined receptive and expressive aphasia   Encephalopathy   HTN (hypertension)    Assessment and Plan: Acute CVA History of CVA Concern for combined receptive and expressive aphasia Noncompliance with medications. -Patient present emergency department complaining of expressive hypoxia started on Wednesday 6/25 2025.  Previous history of CVA in 2024 been treated with dual antiplatelet therapy for 3 weeks followed by aspirin  alone.  However patient has been noncompliant with medication.  At presentation to ED found to have both expressive and receptive aphasia. -MRI showed moderate-sized acute left MCA territory cortical subcortical infraction as sent to temporal lobe additional small patchy patchy acute cortical and subcortical left MCA territory.Iinfarcts as described. Background parenchymal atrophy, chronic small vessel ischemic disease and chronic infarcts. -ED physician consulted neurology Dr. Deedra recommended medicine admission for stroke workup and who will inform Dr. Jerrie for further evaluation. -Obtaining CT head and neck. -CBC and BMP unremarkable.  Normal PT/INR - EKG showing sinus bradycardia heart rate 57 and patient is hypertensive however blood pressure has been improved. - Will do stroke swallow screen afterward if patient passes will start oral diet. -At home patient is on aspirin  81 mg daily however unsure about the compliance. - Giving aspirin  325 mg and Plavix  300 mg load tonight followed by continue aspirin  81 mg daily and Plavix  75 mg daily from tomorrow.  Will await for neurology for definite antiplatelet recommendation for long-term care. - Obtaining echocardiogram -consulting PT, OT and speech therapy. - Continue permissive hypertension for next 24 hours.  Goal of blood pressure below 210/120 for next 24 hours. -Continue neurocheck every 4  hours. -Checking A1c lipid panel level. - Continue Lipitor 40 mg daily - Continue cardiac monitoring for development of any arrhythmia. -Continue pharmacological DVT prophylaxis.   Acute encephalopathy - Patient is actually oriented x 2. -Pending UDS and melena blood alcohol within normal range. -MRI of the brain showed evidence of acute CVA.  Concern for acute metabolic encephalopathy/confusion in the setting of acute CVA. - Continue neurocheck every 4 hours. - Checking UDS vitamin B12 and ammonia level.  Essential hypertension - Patient is noncompliant with blood pressure regimen.  At presentation to ED patient found borderline hypertensive blood pressure has been improved.  Continue IV hydralazine  as needed   History of polysubstance use and marijuana smoking -History of polysubstance use and marijuana use pending UDS.  Blood alcohol level within normal range.    DVT prophylaxis:  SCDs.  And Lovenox  Code Status:  Full Code Diet: Currently NPO.  Pending speech swallow screen Family Communication:   Family was present at bedside, at the time of interview. Opportunity was given to ask question and all questions were answered satisfactorily.  Disposition Plan: Waiting for neurology recommendation for antiplatelet therapy. Consults: Neurology Admission status:   Observation, Telemetry bed  Severity of Illness: The appropriate patient status for this patient is OBSERVATION. Observation status is judged to be reasonable and necessary in order to provide the required intensity of service to ensure the patient's safety. The patient's presenting symptoms, physical exam findings, and initial radiographic and laboratory data in the context  of their medical condition is felt to place them at decreased risk for further clinical deterioration. Furthermore, it is anticipated that the patient will be medically stable for discharge from the hospital within 2 midnights of admission.     Richard Montz, MD [facility]  How to contact the [facility] Attending or Consulting provider 7A - 7P or covering provider during after hours 7P -7A, for this patient.  Check the care team in [facility] and look for a) attending/consulting [facility] provider listed and b) the [facility] team listed Log into [email or web address] and use [facility] universal password to access. If you do not have the password, please contact the hospital operator. Locate the [facility] provider you are looking for under [facility] and page to a number that you can be directly reached. If you still have difficulty reaching the provider, please page the [facility] (Director on Call) for the Hospitalists listed on amion for assistance.  09/25/2023, 7:51 PM

## 2023-09-25 NOTE — ED Notes (Signed)
 Patient transported to MRI

## 2023-09-25 NOTE — ED Notes (Signed)
 Pt did not answer for 2nd trop at registration also stated he did not answer them either at

## 2023-09-25 NOTE — ED Notes (Signed)
 Patient is being discharged from the Urgent Care and sent to the Emergency Department via POV . Per Eric,PA, patient is in need of higher level of care due to possible stroke x3 days. Patient is aware and verbalizes understanding of plan of care.  Vitals:   09/25/23 0814  BP: (!) 167/130  Pulse: (!) 54  Resp: 18  Temp: 98.5 F (36.9 C)  SpO2: 94%

## 2023-09-25 NOTE — ED Provider Notes (Signed)
 [place] EMERGENCY DEPARTMENT AT [facility] Provider Note   CSN: [id number] Arrival date & time: 09/25/23  1428     Patient presents with: Aphasia   Richard Barrera is a 58 y.o. male.   HPI Patient with history of previous stroke.  That was back in October.  Since Wednesday last week with today being Friday has had difficulty speaking.  Reported has been off medicines the last couple days.  Had previous difficulty speaking with the stroke.     Past Medical History:  Diagnosis Date   Hypertension    Kidney stone     Prior to Admission medications   Medication Sig Start Date End Date Taking? Authorizing Provider  amitriptyline (ELAVIL) 25 MG tablet Take 12.5 mg by mouth 2 (two) times daily.    [provider]  amLODipine  (NORVASC ) 10 MG tablet Take 1 tablet (10 mg total) by mouth daily. 01/19/23   Jillian Buttery, MD  aspirin  81 MG chewable tablet Chew 1 tablet (81 mg total) by mouth daily. 01/20/23   Jillian Buttery, MD  famotidine  (PEPCID ) 20 MG tablet Take 1 tablet (20 mg total) by mouth 2 (two) times daily. 12/28/20   Kabbe, Angela M, NP  fluticasone  (FLONASE ) 50 MCG/ACT nasal spray Place 2 sprays into both nostrils daily. Patient taking differently: Place 2 sprays into both nostrils 2 (two) times daily as needed for allergies. 05/14/21   Graham, Laura E, PA-C  gabapentin (NEURONTIN) 100 MG capsule Take 100 mg by mouth 3 (three) times daily. 01/09/23   [provider]  loratadine (CLARITIN) 10 MG tablet Take 10 mg by mouth daily. 08/10/22   [provider]  losartan  (COZAAR ) 50 MG tablet Take 1 tablet (50 mg total) by mouth daily. 01/19/23   Adhikari, Amrit, MD  mirtazapine (REMERON) 7.5 MG tablet Take 7.5 mg by mouth at bedtime as needed (for pain/sleep).    [provider]  ondansetron  (ZOFRAN -ODT) 4 MG disintegrating tablet Take 1 tablet (4 mg total) by mouth every 8 (eight) hours as needed for nausea or vomiting. 01/12/23    Vonna Sharlet POUR, MD  pantoprazole  (PROTONIX ) 20 MG tablet Take 1 tablet (20 mg total) by mouth daily. 01/19/23   Adhikari, Amrit, MD  promethazine (PHENERGAN) 25 MG tablet Take 25 mg by mouth 3 (three) times daily as needed for nausea or vomiting. 01/13/23   [provider]  rosuvastatin  (CRESTOR ) 40 MG tablet Take 1 tablet (40 mg total) by mouth daily. 01/20/23   Adhikari, Amrit, MD  sildenafil (VIAGRA) 100 MG tablet Take 100 mg by mouth daily as needed for erectile dysfunction.    [provider]  SUMAtriptan (IMITREX) 50 MG tablet Take 50 mg by mouth every 2 (two) hours as needed for migraine. 01/13/23   [provider]    Allergies: Patient has no known allergies.    Review of Systems  Updated Vital Signs BP (!) 148/91   Pulse (!) 55   Temp 97.7 F (36.5 C) (Axillary)   Resp 18   SpO2 100%   Physical Exam Vitals and nursing note reviewed.   Cardiovascular:     Rate and Rhythm: Regular rhythm.  Pulmonary:     Breath sounds: No wheezing or rhonchi.   Neurological:     Mental Status: He is alert.     Comments: Eye movement intact.  Face symmetric.  Good grip strength bilaterally.  Somewhat difficult to get complete examination due to aphasia but does.  Abnormal gait.  Does have both receptive and expressive aphasia.    (all labs ordered are listed, but only abnormal results are displayed) Labs Reviewed  CBC - Abnormal; Notable for the following components:      Result Value   RBC 6.02 (*)    Hemoglobin 17.9 (*)    HCT 53.5 (*)    All other components within normal limits  COMPREHENSIVE METABOLIC PANEL WITH GFR - Abnormal; Notable for the following components:   CO2 21 (*)    Total Bilirubin 1.3 (*)    All other components within normal limits  I-STAT CHEM 8, ED - Abnormal; Notable for the following components:   Hemoglobin 18.7 (*)    HCT 55.0 (*)    All other components within normal limits  PROTIME-INR  APTT  DIFFERENTIAL  ETHANOL   RAPID URINE DRUG SCREEN, HOSP PERFORMED  URINALYSIS, W/ REFLEX TO CULTURE (INFECTION SUSPECTED)  CBG MONITORING, ED    EKG: EKG Interpretation Date/Time:  Friday September 25 2023 15:29:35 EDT Ventricular Rate:  52 PR Interval:  151 QRS Duration:  100 QT Interval:  461 QTC Calculation: 429 R Axis:   39  Text Interpretation: Sinus rhythm Biatrial enlargement RSR' in V1 or V2, right VCD or RVH LVH with secondary repolarization abnormality No significant change since last tracing Confirmed by Patsey Lot [phone]) on 09/25/2023 6:01:43 PM  Radiology: No results found.   Procedures   Medications Ordered in the ED  LORazepam  (ATIVAN ) injection 1 mg (1 mg Intravenous Given 09/25/23 1737)                                    Medical Decision Making Amount and/or Complexity of Data Reviewed Labs: ordered. Radiology: ordered.  Risk Prescription drug management. Decision regarding hospitalization.   Patient with focal neurodeficits.  Motor intact.  Difficulty speaking.  Both expressive and receptive aphasia.  Last normal either a week ago or 2 days ago.  Either way not a TNK candidate.  Discussed with Dr. Voncile from neurology.  Will get MRI.  If stroke will need admission.  MRI done and does show acute stroke.  Blood work otherwise reassuring.  Will require admission to hospital.  Discussed with neurology again who informing of the stroke.  CRITICAL CARE Performed by: Lot Patsey Total critical care time: 30 minutes Critical care time was exclusive of separately billable procedures and treating other patients. Critical care was necessary to treat or prevent imminent or life-threatening deterioration. Critical care was time spent personally by me on the following activities: development of treatment plan with patient and/or surrogate as well as nursing, discussions with consultants, evaluation of patient's response to treatment, examination of patient, obtaining history  from patient or surrogate, ordering and performing treatments and interventions, ordering and review of laboratory studies, ordering and review of radiographic studies, pulse oximetry and re-evaluation of patient's condition.      Final diagnoses:  Cerebral infarction, unspecified mechanism [facility])    ED Discharge Orders     None          Patsey Lot, MD 09/25/23 1858

## 2023-09-25 NOTE — ED Triage Notes (Signed)
 Pt states he is having a hard time getting his words out and it started 2 days ago. Denies numbness/tingling/headaches/dizziness. Axox4.

## 2023-09-25 NOTE — ED Triage Notes (Signed)
 Per sister, pt has been confused and unable to get his words out x3 days. States hx of stroke 12/2022. Pt states hasn't taken his meds in past 2 days. Provider at bedside. No facial droop or drift noted. Pt states his speech is off unable to say what he wants too.

## 2023-09-25 NOTE — ED Provider Triage Note (Signed)
 Emergency Medicine Provider Triage Evaluation Note  Richard Barrera , a 58 y.o. male  was evaluated in triage.  Pt complains of expressive aphasia for the past 48 hours.  The patient is also had nausea and vomiting.  Denies chest pain.  Denies headache.  Review of Systems  Positive: Aphasia, nausea, vomiting Negative: Headache, chest pain  Physical Exam  BP (!) 155/111 (BP Location: Right Arm)   Pulse 69   Temp 99.4 F (37.4 C)   Resp 18   Ht 6' 1 (1.854 m)   Wt 81.6 kg   SpO2 99%   BMI 23.75 kg/m  Gen:   Awake, no distress   Resp:  Normal effort  MSK:   Moves extremities without difficulty  Other:  Mild expressive aphasia noted, no obvious focal deficits noted  Medical Decision Making  Medically screening exam initiated at 8:51 AM.  Appropriate orders placed.  Nochum Fenter was informed that the remainder of the evaluation will be completed by another provider, this initial triage assessment does not replace that evaluation, and the importance of remaining in the ED until their evaluation is complete.  Given nausea and vomiting will initiate cardiac workup for atypical ACS.  EKG is abnormal but appears similar to baseline EKG.  Zofran  ODT ordered.  The patient is not complaining of headache and does not have any obvious focal deficits so we will move forward with MRI.  Suspicion for intracranial hemorrhage is low with no headache and overall relatively reassuring exam with exception of mild expressive aphasia.   Ula Prentice SAUNDERS, MD 09/25/23 [phone]

## 2023-09-26 ENCOUNTER — Observation Stay (HOSPITAL_COMMUNITY)

## 2023-09-26 ENCOUNTER — Other Ambulatory Visit (HOSPITAL_COMMUNITY): Payer: Self-pay

## 2023-09-26 DIAGNOSIS — N289 Disorder of kidney and ureter, unspecified: Secondary | ICD-10-CM

## 2023-09-26 DIAGNOSIS — F1210 Cannabis abuse, uncomplicated: Secondary | ICD-10-CM

## 2023-09-26 DIAGNOSIS — I639 Cerebral infarction, unspecified: Secondary | ICD-10-CM | POA: Diagnosis not present

## 2023-09-26 DIAGNOSIS — E785 Hyperlipidemia, unspecified: Secondary | ICD-10-CM | POA: Diagnosis not present

## 2023-09-26 DIAGNOSIS — I63512 Cerebral infarction due to unspecified occlusion or stenosis of left middle cerebral artery: Principal | ICD-10-CM

## 2023-09-26 DIAGNOSIS — I5030 Unspecified diastolic (congestive) heart failure: Secondary | ICD-10-CM

## 2023-09-26 DIAGNOSIS — I110 Hypertensive heart disease with heart failure: Secondary | ICD-10-CM

## 2023-09-26 DIAGNOSIS — R29701 NIHSS score 1: Secondary | ICD-10-CM

## 2023-09-26 DIAGNOSIS — I6389 Other cerebral infarction: Secondary | ICD-10-CM

## 2023-09-26 LAB — BASIC METABOLIC PANEL WITH GFR
Anion gap: 12 (ref 5–15)
BUN: 12 mg/dL (ref 6–20)
CO2: 19 mmol/L — ABNORMAL LOW (ref 22–32)
Calcium: 9.2 mg/dL (ref 8.9–10.3)
Chloride: 106 mmol/L (ref 98–111)
Creatinine, Ser: 0.96 mg/dL (ref 0.61–1.24)
GFR, Estimated: >60 mL/min (ref >60)
Glucose, Bld: 87 mg/dL (ref 70–99)
Potassium: 3.8 mmol/L (ref 3.5–5.1)
Sodium: 137 mmol/L (ref 135–145)

## 2023-09-26 LAB — CBC
HCT: 52.4 % — ABNORMAL HIGH (ref 39.0–52.0)
Hemoglobin: 17.5 g/dL — ABNORMAL HIGH (ref 13.0–17.0)
MCH: 29.4 pg (ref 26.0–34.0)
MCHC: 33.4 g/dL (ref 30.0–36.0)
MCV: 88.1 fL (ref 80.0–100.0)
Platelets: 189 10*3/uL (ref 150–400)
RBC: 5.95 MIL/uL — ABNORMAL HIGH (ref 4.22–5.81)
RDW: 15 % (ref 11.5–15.5)
WBC: 3.8 10*3/uL — ABNORMAL LOW (ref 4.0–10.5)
nRBC: 0 % (ref 0.0–0.2)

## 2023-09-26 LAB — LIPID PANEL
Cholesterol: 199 mg/dL (ref 0–200)
HDL: 30 mg/dL — ABNORMAL LOW (ref >40)
LDL Cholesterol: 147 mg/dL — ABNORMAL HIGH (ref 0–99)
Total CHOL/HDL Ratio: 6.6 ratio
Triglycerides: 109 mg/dL (ref <150)
VLDL: 22 mg/dL (ref 0–40)

## 2023-09-26 LAB — ECHOCARDIOGRAM COMPLETE BUBBLE STUDY
Area-P 1/2: 2.11 cm2
S' Lateral: 3.1 cm

## 2023-09-26 MED ORDER — PERFLUTREN LIPID MICROSPHERE
1.0000 mL | INTRAVENOUS | Status: DC | PRN
  Administered 2023-09-26: 3 mL via INTRAVENOUS

## 2023-09-26 MED ORDER — CLOPIDOGREL BISULFATE 75 MG PO TABS
75.0000 mg | ORAL_TABLET | Freq: Every day | ORAL | 0 refills | Status: AC
  Filled 2023-09-26: qty 90, 90d supply, fill #0

## 2023-09-26 MED ORDER — AMLODIPINE BESYLATE 10 MG PO TABS
10.0000 mg | ORAL_TABLET | Freq: Every day | ORAL | 0 refills | Status: AC
  Filled 2023-09-26: qty 90, 90d supply, fill #0

## 2023-09-26 MED ORDER — EZETIMIBE 10 MG PO TABS
10.0000 mg | ORAL_TABLET | Freq: Every day | ORAL | Status: DC
  Administered 2023-09-26: 10 mg via ORAL
  Filled 2023-09-26: qty 1

## 2023-09-26 MED ORDER — EZETIMIBE 10 MG PO TABS
10.0000 mg | ORAL_TABLET | Freq: Every day | ORAL | 0 refills | Status: AC
  Filled 2023-09-26: qty 90, 90d supply, fill #0

## 2023-09-26 MED ORDER — CLOPIDOGREL BISULFATE 75 MG PO TABS: 75.0000 mg | ORAL_TABLET | Freq: Every day | ORAL | Status: DC

## 2023-09-26 MED ORDER — LOSARTAN POTASSIUM 50 MG PO TABS
50.0000 mg | ORAL_TABLET | Freq: Every day | ORAL | 0 refills | Status: AC
  Filled 2023-09-26: qty 90, 90d supply, fill #0

## 2023-09-26 MED ORDER — CLOPIDOGREL BISULFATE 75 MG PO TABS
300.0000 mg | ORAL_TABLET | Freq: Once | ORAL | Status: AC
  Administered 2023-09-26: 300 mg via ORAL

## 2023-09-26 MED ORDER — IOHEXOL 350 MG/ML SOLN
75.0000 mL | Freq: Once | INTRAVENOUS | Status: AC | PRN
  Administered 2023-09-26: 75 mL via INTRAVENOUS

## 2023-09-26 NOTE — Hospital Course (Addendum)
 58 y.o. M with HTN, stroke p/w 3d aphasia.  Had been out of meds for a few days.  MRI in the ER showed left MCA stroke.

## 2023-09-26 NOTE — Evaluation (Signed)
 Speech Language Pathology Evaluation Patient Details Name: Richard Barrera MRN: [id number] DOB: November 11, 1965 Today's Date: 09/26/2023 Time: 8593-8565 SLP Time Calculation (min) (ACUTE ONLY): 28 min  Problem List:  Patient Active Problem List   Diagnosis Date Noted   Acute CVA (cerebrovascular accident) (HCC) 01/18/2023   HTN (hypertension) 01/18/2023   Polysubstance abuse (HCC) 01/18/2023   Past Medical History:  Past Medical History:  Diagnosis Date   Hypertension    Kidney stone    Past Surgical History:  Past Surgical History:  Procedure Laterality Date   BACK SURGERY     TRANSESOPHAGEAL ECHOCARDIOGRAM (CATH LAB) N/A 01/19/2023   Procedure: TRANSESOPHAGEAL ECHOCARDIOGRAM;  Surgeon: Lonni Slain, MD;  Location: [facility] INVASIVE CV LAB;  Service: Cardiovascular;  Laterality: N/A;   HPI:  58 y.o. male presented to ED with two day hx of difficulty with word-finding and speech changes. MRI moderate sized acute L cortical infarct within the insula and temporal lobe. 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 At time of D/C from hospital in October he had speech deficits. No record of SLP evals either inpt or OP.   Assessment / Plan / Recommendation Clinical Impression  Mr. 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 Naming to confrontation, responsive naming, and generative naming were all WNL excluding occasional phonemic errors.  Repetition intact.  Comprehension marked by mild delays in processing time with requests for repetition of instruction; intact ability to follow one and two-step commands; difficulty following multistep commands.  Left/right discrimination and word discrimination were WNL.  Pt was interactive and expressed concerns about his speech clarity and its impact on professional relationships. He discussed his grief surrounding his health and the future. Support  provided. He was open to OP speech therapy but had questions about insurance coverage.  If feasable financially, recommend referral to OP SLP to address the above.  No further acute care SLP is needed.    SLP Assessment  SLP Recommendation/Assessment: All further Speech Language Pathology needs can be addressed in the next venue of care SLP Visit Diagnosis: Aphasia (R47.01)     Assistance Recommended at Discharge   Intermittent assistance                 SLP Evaluation Cognition          Comprehension  Auditory Comprehension Overall Auditory Comprehension: Appears within functional limits for tasks assessed Yes/No Questions: Within Functional Limits Commands: Impaired Multistep Basic Commands: 75-100% accurate Interfering Components: Processing speed EffectiveTechniques: Extra processing time Visual Recognition/Discrimination Discrimination: Within Function Limits Reading Comprehension Reading Status: Not tested    Expression Expression Primary Mode of Expression: Verbal Verbal Expression Overall Verbal Expression: Impaired Initiation: No impairment Level of Generative/Spontaneous Verbalization: Conversation Repetition: No impairment Naming: Impairment Responsive: 76-100% accurate Confrontation: Within functional limits Divergent: 75-100% accurate Verbal Errors: Phonemic paraphasias Pragmatics: No impairment Written Expression Dominant Hand: Right Written Expression: Not tested   Oral / Motor  Oral Motor/Sensory Function Overall Oral Motor/Sensory Function: Within functional limits Motor Speech Overall Motor Speech: Appears within functional limits for tasks assessed            Vona Palma Laurice 09/26/2023, 3:02 PM Palma L. Vona, MA CCC/SLP Clinical Specialist - Acute Care SLP Acute Rehabilitation Services Office number [phone]

## 2023-09-26 NOTE — Assessment & Plan Note (Addendum)
 MRI brain showed patchy cortical subcortical left MCA territory infarcts. - Non-invasive angiography showed  Occlusion of a proximal left M2  - CTA chest - Echocardiogram showed   - Carotid imaging unremarkable   - Lipids ordered:  - Aspirin   - Evaluation for arrhythmia/atrial fibrillation:  - tPA not given because  - Dysphagia screen ordered in ER - PT eval ordered:  - Nonsmoker  Encephalopathy ruled out

## 2023-09-26 NOTE — Evaluation (Signed)
 Occupational Therapy Evaluation Patient Details Name: Richard Barrera MRN: [id number] DOB: [birth year] Today's Date: 09/26/2023   History of Present Illness   58 y.o. male who presents to [facility] on 09/25/23 with c/o speech and word finding difficulty. MRI demonstrated acute L cortical infarct within the insula and temporal lobe. PMH: hypertension not on antihypertensives, history of polysubstance abuse, GERD, L frontal lobe infarct.     Clinical Impressions Pt admitted for above, PTA pt reports living alone and being ind with ADLs and his sister helps with medication management. He does not have much buy in from the therapy process, based no today's assessment pt has some cognitive impairments with memory and problem solving. He declined OOB, stated that he ambulated already with PT without challenges but PT never worked with pt. His cognition seems quite questionable, OT to continue following pt while in acute stay for cognitive deficits. Recommend outpt neuro OT.      If plan is discharge home, recommend the following:   Assistance with cooking/housework;Supervision due to cognitive status;Direct supervision/assist for medications management     Functional Status Assessment   Patient has had a recent decline in their functional status and/or demonstrates limited ability to make significant improvements in function in a reasonable and predictable amount of time     Equipment Recommendations   None recommended by OT     Recommendations for Other Services         Precautions/Restrictions   Precautions Precautions: Fall Recall of Precautions/Restrictions: Impaired Restrictions Weight Bearing Restrictions Per Provider Order: No     Mobility Bed Mobility Overal bed mobility: Modified Independent             General bed mobility comments: moved to EOB without assist.    Transfers                   General transfer comment: Pt deferred, reports that he  already ambulating up and down the hallway with PT. The PT actually stated that this pt declined working with them      Balance Overall balance assessment:  (TBD)                                         ADL either performed or assessed with clinical judgement   ADL                                         General ADL Comments: Pt not interested in much of therapy process. Was at least receptive to cognition testing, he reports at baseline.     Vision   Vision Assessment?: No apparent visual deficits     Perception         Praxis Praxis: WFL       Pertinent Vitals/Pain Pain Assessment Pain Assessment: No/denies pain     Extremity/Trunk Assessment Upper Extremity Assessment Upper Extremity Assessment: Difficult to assess due to impaired cognition (Pt reports already doing all of the MMT, ROM testing and disinterested in more. ROM WFL from visual observation.)   Lower Extremity Assessment Lower Extremity Assessment: Difficult to assess due to impaired cognition       Communication Communication Communication: Other (comment) (unsure if HOH, repeat commands necessary)   Cognition Arousal: Alert Behavior During Therapy:  (Not too agitated, not aggressive.  A bit irritable.) Cognition: Cognition impaired     Awareness: Intellectual awareness impaired Memory impairment (select all impairments): Short-term memory, Working memory     OT - Cognition Comments: Pt scored 2/3 on STM recall of mini cog, needed repetitive attempts to have any carryover of the words. Increased time needed to perform his clock draw assessment. Messed up on his first attempt and needed OT to repeat original commands.                 Following commands: Intact       Cueing  General Comments   Cueing Techniques: Verbal cues      Exercises     Shoulder Instructions      Home Living Family/patient expects to be discharged to:: Private  residence Living Arrangements: Alone Available Help at Discharge: Family;Available PRN/intermittently Type of Home: House Home Access: Level entry     Home Layout: One level     Bathroom Shower/Tub: Chief Strategy Officer: Standard     Home Equipment: None          Prior Functioning/Environment Prior Level of Function : Independent/Modified Independent;Driving             Mobility Comments: ind ADLs Comments: ind; his sister performs medication management-setting up his pillbox    OT Problem List: Decreased cognition   OT Treatment/Interventions: Patient/family education;Cognitive remediation/compensation;Therapeutic activities      OT Goals(Current goals can be found in the care plan section)   Acute Rehab OT Goals Patient Stated Goal: go home by Monday OT Goal Formulation: With patient Time For Goal Achievement: 10/10/23 Potential to Achieve Goals: Good ADL Goals Additional ADL Goal #1: Pt will score WFL on a cognitive assesment to deterimne safe return to independent community living.   OT Frequency:  Min 1X/week    Co-evaluation              AM-PAC OT 6 Clicks Daily Activity     Outcome Measure Help from another person eating meals?: None Help from another person taking care of personal grooming?: None Help from another person toileting, which includes using toliet, bedpan, or urinal?: None Help from another person bathing (including washing, rinsing, drying)?: None Help from another person to put on and taking off regular upper body clothing?: None Help from another person to put on and taking off regular lower body clothing?: None (unsure if pt is close to baseline. He was disinterested in full therapy process) 6 Click Score: 24   End of Session    Activity Tolerance: Other (comment) (pt disinterested in full assessment) Patient left: in bed;with call bell/phone within reach;with bed alarm set  OT Visit Diagnosis: Other  symptoms and signs involving cognitive function;Cognitive communication deficit (R41.841) Symptoms and signs involving cognitive functions: Cerebral infarction                Time: 8853-8798 OT Time Calculation (min): 15 min Charges:  OT General Charges $OT Visit: 1 Visit OT Evaluation $OT Eval Low Complexity: 1 Low  09/26/2023  AB, OTR/L  Acute Rehabilitation Services  Office: [phone]   Curtistine JONETTA Das 09/26/2023, 1:14 PM

## 2023-09-26 NOTE — Progress Notes (Signed)
 Echocardiogram 2D Echocardiogram has been performed.  Richard Barrera,RDCS 09/26/2023, 3:27 PM

## 2023-09-26 NOTE — Progress Notes (Signed)
 PT Cancellation Note  Patient Details Name: Richard Barrera MRN: [id number] DOB: 04-09-1965   Cancelled Treatment:    Reason Eval/Treat Not Completed: PT screened, no needs identified, will sign off (Pt reports being at baseline for mobility and declines PT eval at this time. Pt's only concern is his speech. Acute PT signing off. Please re-consult if new needs arise.)  Kate ORN, PT, DPT Secure Chat Preferred  Rehab Office [phone]  Kate BRAVO Wendolyn 09/26/2023, 8:43 AM

## 2023-09-26 NOTE — Discharge Summary (Signed)
 Physician Discharge Summary   Patient: Richard Barrera MRN: [id number] DOB: Nov 09, 1965  Admit date:     09/25/2023  Discharge date: 09/26/23  Discharge Physician: Lonni SHAUNNA Dalton   PCP: [facility]     Recommendations at discharge:  Follow up with PCP within 1 week for stroke, renal lesion Follow up with [facility] in 6-8 weeks for [facility]: Please obtain outpatient MRI abdomen to follow up right renal lesion Please check lipids in 8-12 weeks (LDL in hospital 147, Zetia added)     Discharge Diagnoses: Principal Problem:   Acute CVA (cerebrovascular accident) [facility]) Active Problems:   HTN (hypertension)   Right renal lesion      Hospital Course: 58 y.o. M with HTN, stroke p/w 3d aphasia.  Had been out of meds for a few days.  MRI in the ER showed left MCA stroke.      * Acute CVA (cerebrovascular accident) (HCC) Encephalopathy ruled out.  MRI brain showed patchy cortical subcortical left MCA territory infarcts. - Non-invasive angiography showed occlusion of a proximal left M2  - Given previous echo with suspected intrapulmonary shunt, CTA chest obtained that showed no pulmonary AVM - Echocardiogram showed low normal EF, no cardiogenic source of embolism - Carotid imaging unremarkable   - Lipids ordered: LDL 147, not consistent with Crestor  use; Zetia added - Aspirin  and Plavix  for 3 months then Plavix  alone indefinitely - Evaluation for arrhythmia/atrial fibrillation: None on monitoring - tPA not given because outside window - Dysphagia screen ordered in ER - PT eval ordered --> Referral to outpatient OT and SLP - Smoking and TSH cessation teaching provided     Renal lesion Incidental finding on CT.  Recommend nonurgent renal protocol MR.            [place]  Controlled Substances Registry was reviewed for this patient prior to discharge.  Consultants: Neurology   Disposition: Home Diet  recommendation:  Discharge Diet Orders (From admission, onward)     Start     Ordered   09/26/23 0000  Diet - low sodium heart healthy        09/26/23 1547             DISCHARGE MEDICATION: Allergies as of 09/26/2023   No Known Allergies      Medication List     STOP taking these medications    ondansetron  4 MG disintegrating tablet Commonly known as: ZOFRAN -ODT       TAKE these medications    amLODipine  10 MG tablet Commonly known as: NORVASC  Take 1 tablet (10 mg total) by mouth daily.   aspirin  81 MG chewable tablet Chew 1 tablet (81 mg total) by mouth daily.   clopidogrel  75 MG tablet Commonly known as: PLAVIX  Take 1 tablet (75 mg total) by mouth daily.   ezetimibe 10 MG tablet Commonly known as: ZETIA Take 1 tablet (10 mg total) by mouth daily. Start taking on: September 27, 2023   famotidine  20 MG tablet Commonly known as: PEPCID  Take 1 tablet (20 mg total) by mouth 2 (two) times daily.   fluticasone  50 MCG/ACT nasal spray Commonly known as: FLONASE  Place 2 sprays into both nostrils daily.   losartan  50 MG tablet Commonly known as: COZAAR  Take 1 tablet (50 mg total) by mouth daily.   pantoprazole  20 MG tablet Commonly known as: PROTONIX  Take 1 tablet (20 mg total) by mouth daily.   rosuvastatin  40 MG tablet Commonly known as: CRESTOR  Take 1  tablet (40 mg total) by mouth daily.        Follow-up Information     [facility] Schedule an appointment as soon as possible for a visit in 1 week(s).   Contact information: [address] [facility] [place] [address] [phone]         [facility] Follow up.   Contact information: [address]     [address] [place]  [address] [phone]                Discharge Instructions     Ambulatory referral to Occupational Therapy   Complete by: As directed    For acute stroke   Ambulatory referral to Speech Therapy    Complete by: As directed    For aphasia due to acute stroke   Diet - low sodium heart healthy   Complete by: As directed    Discharge instructions   Complete by: As directed    **IMPORTANT DISCHARGE INSTRUCTIONS**   From Dr. Jonel: You were admitted for an acute stroke.  Your testing here showed that this was likely due to fatty plaque build up in the arteries of your head and neck  To reduce this fatty plaque, take your rosuvastatin /Crestor  without missing doses Take it at night to reduce side effects ADD the medicine ezetimibe/Zetia, which boosts it's effectiveness  Continue your aspirin  and clopidogrel /Plavix   Go see the Neurology specialists, your neurology doctor at [facility] in 6 weeks  Recent data suggest use of THC/cannabis is associated unfortunately with vascular disease (heart attack and stroke) unfortunately, even in smoked or edible form  ALSO: your CT scan showed an incidental finding on your right kidney  Go see your primary doctor and ask for follow up imaging.  Show them this paperwork and explain you need an MRI of the right kidney   Increase activity slowly   Complete by: As directed        Discharge Exam: There were no vitals filed for this visit.  General: Pt is alert, awake, not in acute distress Cardiovascular: RRR, nl S1-S2, no murmurs appreciated.   No LE edema.   Respiratory: Normal respiratory rate and rhythm.  CTAB without rales or wheezes. Abdominal: Abdomen soft and non-tender.  No distension or HSM.   Neuro/Psych: Strength symmetric in upper and lower extremities.  Judgment and insight appear normal.  Very mild aphasia noted.   Condition at discharge: good  The results of significant diagnostics from this hospitalization (including imaging, microbiology, ancillary and laboratory) are listed below for reference.   Imaging Studies: ECHOCARDIOGRAM COMPLETE BUBBLE STUDY Result Date: 09/26/2023    ECHOCARDIOGRAM  REPORT   [name] Date of Exam: 09/26/2023 Medical Rec #:  [id number]      Height:       73.0 in Accession #:    [id number]     Weight:       180.0 lb Date of Birth:  09-22-1965      BSA:          2.057 m Patient Age:    57 years       BP:           153/99 mmHg Patient Gender: M              HR:           61 bpm. Exam Location:  Inpatient Procedure: 2D Echo, 3D Echo, Cardiac Doppler, Color Doppler, Strain Analysis,  Intracardiac Opacification Agent and Saline Contrast Bubble Study            (Both Spectral and Color Flow Doppler were utilized during            procedure). Indications:    Stroke  History:        Patient has prior history of Echocardiogram examinations, most                 recent 01/18/2023. Stroke; Risk Factors:Hypertension and Former                 Smoker. Polysubstance Abuse.  Sonographer:    Logan Shove RDCS Referring Phys: [phone] Brandalynn Ofallon P Quantae Martel IMPRESSIONS  1. Left ventricular ejection fraction, by estimation, is 55 to 60%. Left ventricular ejection fraction by 3D volume is 57 %. The left ventricle has normal function. The left ventricle has no regional wall motion abnormalities. There is moderate concentric left ventricular hypertrophy. Left ventricular diastolic parameters are consistent with Grade I diastolic dysfunction (impaired relaxation). The average left ventricular global longitudinal strain is -12.1 %. The global longitudinal strain is abnormal.  2. Right ventricular systolic function is normal. The right ventricular size is normal.  3. Left atrial size was moderately dilated.  4. The mitral valve is normal in structure. Trivial mitral valve regurgitation. No evidence of mitral stenosis.  5. The aortic valve is tricuspid. Aortic valve regurgitation is not visualized. No aortic stenosis is present.  6. Agitated saline contrast bubble study was negative, with no evidence of any interatrial shunt. FINDINGS  Left Ventricle: Left ventricular ejection  fraction, by estimation, is 55 to 60%. Left ventricular ejection fraction by 3D volume is 57 %. The left ventricle has normal function. The left ventricle has no regional wall motion abnormalities. Definity contrast agent was given IV to delineate the left ventricular endocardial borders. The average left ventricular global longitudinal strain is -12.1 %. Strain was performed and the global longitudinal strain is abnormal. The left ventricular internal cavity size was normal in size. There is moderate concentric left ventricular hypertrophy. Left ventricular diastolic parameters are consistent with Grade I diastolic dysfunction (impaired relaxation). Normal left ventricular filling pressure. Right Ventricle: The right ventricular size is normal. No increase in right ventricular wall thickness. Right ventricular systolic function is normal. Left Atrium: Left atrial size was moderately dilated. Right Atrium: Right atrial size was normal in size. Pericardium: There is no evidence of pericardial effusion. Mitral Valve: The mitral valve is normal in structure. Trivial mitral valve regurgitation. No evidence of mitral valve stenosis. Tricuspid Valve: The tricuspid valve is normal in structure. Tricuspid valve regurgitation is not demonstrated. Aortic Valve: The aortic valve is tricuspid. Aortic valve regurgitation is not visualized. No aortic stenosis is present. Pulmonic Valve: The pulmonic valve was normal in structure. Pulmonic valve regurgitation is not visualized. No evidence of pulmonic stenosis. Aorta: The aortic root and ascending aorta are structurally normal, with no evidence of dilitation. IAS/Shunts: No atrial level shunt detected by color flow Doppler. Agitated saline contrast was given intravenously to evaluate for intracardiac shunting. Agitated saline contrast bubble study was negative, with no evidence of any interatrial shunt. Additional Comments: 3D was performed not requiring image post processing on  an independent workstation and was normal.  LEFT VENTRICLE PLAX 2D LVIDd:         4.40 cm         Diastology LVIDs:         3.10 cm  LV e' medial:    5.55 cm/s LV PW:         1.50 cm         LV E/e' medial:  6.4 LV IVS:        1.50 cm         LV e' lateral:   8.38 cm/s LVOT diam:     1.90 cm         LV E/e' lateral: 4.2 LV SV:         65 LV SV Index:   32              2D Longitudinal LVOT Area:     2.84 cm        Strain                                2D Strain GLS   -12.1 %                                Avg:                                 3D Volume EF                                LV 3D EF:    Left                                             ventricul                                             ar                                             ejection                                             fraction                                             by 3D                                             volume is                                             57 %.  3D Volume EF:                                3D EF:        57 %                                LV EDV:       139 ml                                LV ESV:       59 ml                                LV SV:        79 ml RIGHT VENTRICLE             IVC RV Basal diam:  3.20 cm     IVC diam: 1.90 cm RV S prime:     33.60 cm/s TAPSE (M-mode): 2.5 cm LEFT ATRIUM             Index        RIGHT ATRIUM           Index LA diam:        4.20 cm 2.04 cm/m   RA Area:     12.60 cm LA Vol (A2C):   92.0 ml 44.72 ml/m  RA Volume:   24.40 ml  11.86 ml/m LA Vol (A4C):   79.0 ml 38.40 ml/m LA Biplane Vol: 86.4 ml 41.99 ml/m  AORTIC VALVE LVOT Vmax:   107.00 cm/s LVOT Vmean:  68.100 cm/s LVOT VTI:    0.229 m  AORTA Ao Root diam: 3.70 cm Ao Asc diam:  3.80 cm MITRAL VALVE MV Area (PHT): 2.11 cm    SHUNTS MV Decel Time: 359 msec    Systemic VTI:  0.23 m MV E velocity: 35.60 cm/s  Systemic Diam: 1.90 cm MV A velocity: 65.00 cm/s MV E/A ratio:   0.55 Mihai Croitoru MD Electronically signed by Jerel Balding MD Signature Date/Time: 09/26/2023/3:53:28 PM    Final    CT ANGIO CHEST AORTA W/CM & OR WO/CM Result Date: 09/26/2023 CLINICAL DATA:  Stroke.  Evaluate for pulmonary AVM. EXAM: CT ANGIOGRAPHY CHEST WITH CONTRAST TECHNIQUE: Multidetector CT imaging of the chest was performed using the standard protocol during bolus administration of intravenous contrast. Multiplanar CT image reconstructions and MIPs were obtained to evaluate the vascular anatomy. RADIATION DOSE REDUCTION: This exam was performed according to the departmental dose-optimization program which includes automated exposure control, adjustment of the mA and/or kV according to patient size and/or use of iterative reconstruction technique. CONTRAST:  75mL OMNIPAQUE  IOHEXOL  350 MG/ML SOLN COMPARISON:  Chest radiograph 07/23/2020. remote CT 04/25/2005 FINDINGS: Cardiovascular: The ascending aorta is dilated measuring 4 cm. No significant calcified or noncalcified atheromatous plaque. There are no filling defects within the pulmonary arteries to suggest pulmonary embolus. No evidence of pulmonary vascular malformation. The heart is upper normal in size. No pericardial effusion. Mediastinum/Nodes: No enlarged mediastinal or hilar lymph nodes. Unremarkable appearance of the esophagus. Minimal hiatal hernia. No thyroid  nodule. Lungs/Pleura: Clear lungs. No focal airspace disease. No pleural fluid. No features of pulmonary edema. The trachea and central airways are clear. No pulmonary  mass. Upper Abdomen: Right hydronephrosis with retention of excreted IV contrast in the renal collecting system. Renal collecting system dilatation is chronic. Thinning of the right renal parenchyma. There is a hyperdense focus in the medial mid right kidney measuring up to 2.6 cm. Vicarious excretion of contrast within the gallbladder. Musculoskeletal: There are no acute or suspicious osseous abnormalities. Review of  the MIP images confirms the above findings. IMPRESSION: 1. No evidence of pulmonary vascular malformation. 2. No acute intrathoracic abnormality. 3. Dilated ascending aorta measuring 4 cm. Recommend annual imaging follow-up by CTA or MRA. 4. Chronic right hydronephrosis with retention of excreted IV contrast in the renal collecting system. Thinning of the right renal parenchyma. Hyperdense focus in the medial mid right kidney measuring up to 2.6 cm. This may represent a hemorrhagic or proteinaceous cyst, however a solid renal mass is not excluded on the current exam. Recommend nonemergent renal protocol MRI for further characterization. Electronically Signed   By: Andrea Gasman M.D.   On: 09/26/2023 15:09   CT ANGIO HEAD NECK W WO CM Result Date: 09/25/2023 CLINICAL DATA:  Acute CVA EXAM: CT ANGIOGRAPHY HEAD AND NECK WITH AND WITHOUT CONTRAST TECHNIQUE: Multidetector CT imaging of the head and neck was performed using the standard protocol during bolus administration of intravenous contrast. Multiplanar CT image reconstructions and MIPs were obtained to evaluate the vascular anatomy. Carotid stenosis measurements (when applicable) are obtained utilizing NASCET criteria, using the distal internal carotid diameter as the denominator. RADIATION DOSE REDUCTION: This exam was performed according to the departmental dose-optimization program which includes automated exposure control, adjustment of the mA and/or kV according to patient size and/or use of iterative reconstruction technique. CONTRAST:  75mL OMNIPAQUE  IOHEXOL  350 MG/ML SOLN COMPARISON:  MRI head from today. FINDINGS: CT HEAD FINDINGS Brain: When comparing across modalities, similar appearance of known acute left MCA territory infarcts. No progressive mass effect or acute hemorrhage. No evidence of mass lesion, midline shift or hydrocephalus. Vascular: See below. Skull: No acute fracture. Sinuses/Orbits: Mostly clear sinuses.  No acute orbital findings.  Other: No mastoid effusions. Review of the MIP images confirms the above findings CTA NECK FINDINGS Aortic arch: Great vessel origins are patent. Right carotid system: No evidence of dissection, stenosis (50% or greater), or occlusion. Left carotid system: No evidence of dissection, stenosis (50% or greater), or occlusion. Vertebral arteries: Codominant. No evidence of dissection, stenosis (50% or greater), or occlusion. Skeleton: No evidence of acute abnormality on limited assessment. Other neck: No evidence of acute abnormality on limited assessment. Upper chest: Visualized lung apices are clear. Review of the MIP images confirms the above findings CTA HEAD FINDINGS Anterior circulation: Bilateral intracranial ICAs are patent without significant stenosis. Right MCA and bilateral ACAs are patent without proximal hemodynamically significant stenosis. Left M1 MCAs patent with mild to moderate distal M1 MCA stenosis. Occlusion of a proximal left M2 MCA with irregular distal reconstitution. Posterior circulation: Bilateral intradural vertebral arteries, basilar artery and bilateral posterior cerebral arteries are patent without proximal hemodynamically significant stenosis. Venous sinuses: Not well assessed due to arterial timing. Review of the MIP images confirms the above findings Provider paged at the time of dictation for call of report. IMPRESSION: 1. Occlusion of a proximal left M2 MCA with irregular distal reconstitution. 2. Known acute left MCA territory infarcts, better characterized on same day MRI. No evidence of progressive mass effect or acute hemorrhage. Electronically Signed   By: Gilmore GORMAN Molt M.D.   On: 09/25/2023 21:16   MR BRAIN  WO CONTRAST Result Date: 09/25/2023 CLINICAL DATA:  Provided history: Neuro deficit, acute, stroke suspected. EXAM: MRI HEAD WITHOUT CONTRAST TECHNIQUE: Multiplanar, multiecho pulse sequences of the brain and surrounding structures were obtained without intravenous  contrast. COMPARISON:  Brain MRI 01/18/2023. FINDINGS: Intermittently motion degraded examination. Most notably, there is moderate-to-severe motion degradation of the axial T2 sequence. Within this limitation, findings are as follows. Brain: No age-advanced or lobar predominant cerebral atrophy. Moderate-sized acute left MCA territory cortical/subcortical infarct within the insula and adjacent temporal lobe. Additional small patchy acute cortical and subcortical left MCA territory infarcts at the temporoparietal junction, within the parietal lobe and within the lateral occipital lobe. Known chronic cortical/subcortical left MCA territory infarcts within the frontal, parietal and occipital lobes, some of which were better appreciated on the prior brain MRI of 01/18/2023 (acute at that time). Unchanged chronic lacunar infarct within the right lentiform nucleus. Background mild multifocal T2 FLAIR hyperintense signal abnormality within the cerebral white matter, nonspecific but compatible chronic small vessel ischemic disease. No evidence of an intracranial mass. No chronic intracranial blood products. No extra-axial fluid collection. No midline shift. Vascular: No definite loss of proximal large arterial flow voids. Skull and upper cervical spine: No focal worrisome marrow lesion. Sinuses/Orbits: No mass or acute finding within the imaged orbits. Small mucous retention cysts within the right maxillary sinus. Mild mucosal thickening versus small mucous retention cyst within a right ethmoid air cell posteriorly. Other: Small-volume fluid within left mastoid air cells. IMPRESSION: 1. Motion degraded examination. 2. Moderate-sized acute left MCA territory cortical/subcortical infarct within the insula and adjacent temporal lobe. Additional small patchy acute cortical and subcortical left MCA territory infarcts as described. 3. Background parenchymal atrophy, chronic small vessel ischemic disease and chronic infarcts. 4.  Paranasal sinus disease as described. 5. Small left mastoid effusion. Electronically Signed   By: Rockey Childs D.O.   On: 09/25/2023 19:13    Microbiology: No results found for this or any previous visit.  Labs: CBC: Recent Labs  Lab 09/25/23 0856 09/25/23 1457 09/25/23 1611 09/26/23 0605  WBC 5.2 4.9  --  3.8*  NEUTROABS  --  3.2  --   --   HGB 18.1* 17.9* 18.7* 17.5*  HCT 54.5* 53.5* 55.0* 52.4*  MCV 90.1 88.9  --  88.1  PLT 206 213  --  189   Basic Metabolic Panel: Recent Labs  Lab 09/25/23 0856 09/25/23 1457 09/25/23 1611 09/26/23 0605  NA 137 136 138 137  K 3.8 3.7 3.7 3.8  CL 103 101 103 106  CO2 22 21*  --  19*  GLUCOSE 96 89 95 87  BUN 10 11 14 12   CREATININE 1.15 1.05 0.90 0.96  CALCIUM  9.8 9.8  --  9.2   Liver Function Tests: Recent Labs  Lab 09/25/23 1457  AST 22  ALT 15  ALKPHOS 55  BILITOT 1.3*  PROT 6.5  ALBUMIN 4.1   CBG: No results for input(s): GLUCAP in the last 168 hours.  Discharge time spent: approximately 45 minutes spent on discharge counseling, evaluation of patient on day of discharge, and coordination of discharge planning with nursing, social work, pharmacy and case management  Signed: Lonni SHAUNNA Dalton, MD [facility] 09/26/2023

## 2023-09-26 NOTE — Consult Note (Addendum)
 Stroke Team Consultation  Reason for Consult: Stroke  Referring Physician: Danford  CC: Stroke MRI   History is obtained from:Patient   HPI: Richard Barrera is a 58 y.o. male history of CVA 01/18/2023, HFpEF 60 to 65%, essential hypertension and polysubstance use presents emergency department complaining of unable to speech/difficulty speaking and word finding. Patient reported that he has been not taking any medication for last couple of days, but he is typically compliant with his medications. Patient lives by himself and sister lives next door. Sister is currently at the bedside. She noticed that he has not been eating and drinking well probably from Wednesday and Thursday she went home to check on him noticed that he continues to have word finding difficulty, getting anxious very quickly and agitated and had few episodes of vomiting. He currently follows with neurology through the [facility]. PT/OT currently recommending outpatient neuro rehab. 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  Workup had shown late bubbles on TEE raising concern for intrapulmonary shunt and CT angiogram of chest was recommended but had not been done.  LKW: Wednesday 09/23/2023 TNK given?: no, outside of window Premorbid modified Rankin scale (mRS):  2-Slight disability-UNABLE to perform all activities but does not need assistance    ROS: Full ROS was performed and is negative except as noted in the HPI.   Past Medical History:  Diagnosis Date   Hypertension    Kidney stone     History reviewed. No pertinent family history.  Social History:   reports that he quit smoking about 21 years ago. His smoking use included cigarettes. He has never used smokeless tobacco. He reports current alcohol use. He reports current drug use. Drug: Marijuana.  Medications  Current Facility-Administered Medications:     stroke: early stages  of recovery book, , Does not apply, Once, Sundil, Subrina, MD   acetaminophen  (TYLENOL ) tablet 650 mg, 650 mg, Oral, Q4H PRN **OR** acetaminophen  (TYLENOL ) 160 MG/5ML solution 650 mg, 650 mg, Per Tube, Q4H PRN **OR** acetaminophen  (TYLENOL ) suppository 650 mg, 650 mg, Rectal, Q4H PRN, Sundil, Subrina, MD   aspirin  EC tablet 81 mg, 81 mg, Oral, Daily, Sundil, Subrina, MD   clopidogrel  (PLAVIX ) tablet 75 mg, 75 mg, Oral, Daily, Sundil, Subrina, MD   enoxaparin  (LOVENOX ) injection 40 mg, 40 mg, Subcutaneous, Q24H, Sundil, Subrina, MD, 40 mg at 09/25/23 2140   famotidine  (PEPCID ) tablet 20 mg, 20 mg, Oral, BID, Sundil, Subrina, MD, 20 mg at 09/25/23 2140   hydrALAZINE  (APRESOLINE ) injection 10 mg, 10 mg, Intravenous, Q6H PRN, Sundil, Subrina, MD   rosuvastatin  (CRESTOR ) tablet 40 mg, 40 mg, Oral, Daily, Sundil, Subrina, MD   senna-docusate (Senokot-S) tablet 1 tablet, 1 tablet, Oral, QHS PRN, Sundil, Subrina, MD   sodium chloride  flush (NS) 0.9 % injection 3-10 mL, 3-10 mL, Intravenous, Q12H, Sundil, Subrina, MD, 10 mL at 09/25/23 2144   sodium chloride  flush (NS) 0.9 % injection 3-10 mL, 3-10 mL, Intravenous, PRN, Sundil, Subrina, MD    Exam: Current vital signs: BP (!) 183/99   Pulse (!) 53   Temp 97.7 F (36.5 C) (Oral)   Resp 14   SpO2 100%  Vital signs in last 24 hours: Temp:  [97.5 F (36.4 C)-99 F (37.2 C)] 97.7 F (36.5 C) (06/28 0746) Pulse Rate:  [52-60] 53 (06/28 0746) Resp:  [14-20] 14 (06/28 0746) BP: (139-188)/(87-119) 183/99 (06/28 0746) SpO2:  [97 %-100 %] 100 % (  06/28 0746)  GENERAL: Awake, alert in NAD HEENT: - Normocephalic and atraumatic, moist mm, no LN++, no Thyromegally LUNGS - Clear to auscultation bilaterally with no wheezes CV - S1S2 RRR, no m/r/g, equal pulses bilaterally. ABDOMEN - Soft, nontender, nondistended with normoactive BS Ext: warm, well perfused, intact peripheral pulses, no edema  NEURO:  Mental Status: AA&Ox3  Language: speech is  dysarthric.  Naming, repetition, fluency, and comprehension intact. Cranial Nerves: PERRL. EOMI, visual fields full, no facial asymmetry, facial sensation intact, hearing intact, tongue/uvula/soft palate midline, normal sternocleidomastoid and trapezius muscle strength. No evidence of tongue atrophy or fibrillations Motor: elevates all extremities antigravity Tone: is normal and bulk is normal Sensation- Intact to light touch bilaterally Coordination: FTN intact bilaterally, no ataxia in BLE. Gait- deferred  NIHSS components Score: Comment  1a Level of Conscious 0[x]  1[]  2[]  3[]         1b LOC Questions 0[x]  1[]  2[]           1c LOC Commands 0[x]  1[]  2[]           2 Best Gaze 0[x]  1[]  2[]           3 Visual 0[x]  1[]  2[]  3[]         4 Facial Palsy 0[x]  1[]  2[]  3[]         5a Motor Arm - left 0[x]  1[]  2[]  3[]  4[]  UN[]     5b Motor Arm - Right 0[x]  1[]  2[]  3[]  4[]  UN[]     6a Motor Leg - Left 0[x]  1[]  2[]  3[]  4[]  UN[]     6b Motor Leg - Right 0[x]  1[]  2[]  3[]  4[]  UN[]     7 Limb Ataxia 0[x]  1[]  2[]  3[]  UN[]       8 Sensory 0[x]  1[]  2[]  UN[]         9 Best Language 0[x]  1[]  2[]  3[]         10 Dysarthria 0[]  1[x]  2[]  UN[]         11 Extinct. and Inattention 0[x]  1[]  2[]           TOTAL:1         Labs I have reviewed labs in epic and the results pertinent to this consultation are:   CBC    Component Value Date/Time   WBC 3.8 (L) 09/26/2023 0605   RBC 5.95 (H) 09/26/2023 0605   HGB 17.5 (H) 09/26/2023 0605   HCT 52.4 (H) 09/26/2023 0605   PLT 189 09/26/2023 0605   MCV 88.1 09/26/2023 0605   MCH 29.4 09/26/2023 0605   MCHC 33.4 09/26/2023 0605   RDW 15.0 09/26/2023 0605   LYMPHSABS 1.2 09/25/2023 1457   MONOABS 0.5 09/25/2023 1457   EOSABS 0.0 09/25/2023 1457   BASOSABS 0.0 09/25/2023 1457    CMP     Component Value Date/Time   NA 137 09/26/2023 0605   K 3.8 09/26/2023 0605   CL 106 09/26/2023 0605   CO2 19 (L) 09/26/2023 0605   GLUCOSE 87 09/26/2023 0605   BUN 12 09/26/2023 0605    CREATININE 0.96 09/26/2023 0605   CALCIUM  9.2 09/26/2023 0605   PROT 6.5 09/25/2023 1457   ALBUMIN 4.1 09/25/2023 1457   AST 22 09/25/2023 1457   ALT 15 09/25/2023 1457   ALKPHOS 55 09/25/2023 1457   BILITOT 1.3 (H) 09/25/2023 1457   GFRNONAA >60 09/26/2023 0605   GFRAA >60 06/26/2016 2229    Lipid Panel     Component Value Date/Time   CHOL 199 09/26/2023 0605   TRIG 109 09/26/2023 9394  HDL 30 (L) 09/26/2023 0605   CHOLHDL 6.6 09/26/2023 0605   VLDL 22 09/26/2023 0605   LDLCALC 147 (H) 09/26/2023 0605     Stroke:  Anterior MCA territory infarct  Etiology:  small vessel disease   CTA head & neck Occlusion of a proximal left M2 MCA with irregular distal reconstitution. CT Chest to r/o pulmonary AVM MRI  Moderate-sized acute left MCA territory cortical/subcortical infarct within the insula and  adjacent temporal lobe. Additional small patchy acute cortical and subcortical left MCA territory infarcts as described. TCD bubble study ordered  CT Chest 1. No evidence of pulmonary vascular malformation. 2. No acute intrathoracic abnormality. 3. Dilated ascending aorta measuring 4 cm. Recommend annual imaging follow-up by CTA or MRA. 4. Chronic right hydronephrosis with retention of excreted IV contrast in the renal collecting system. Thinning of the right renal parenchyma. Hyperdense focus in the medial mid right kidney measuring up to 2.6 cm.  2D Echo pending TCD Bubble pending LDL 147 HgbA1c 4.8 VTE prophylaxis - Lovenox      Diet   Diet Heart Room service appropriate? Yes; Fluid consistency: Thin   aspirin  81 mg daily prior to admission, now on aspirin  81 mg daily and clopidogrel  75 mg daily. For 3 months  Loaded with Plavix  300 Therapy recommendations:  Outpatient rehab Disposition:  Pending completion of work up   Hx stroke  12/2023- Scattered foci of restricted diffusion in the cortical and subcortical white matter of the posterior left frontal and parietal lobe as  well as the lateral aspect of the left occipital lobe. TEE with intrapulmonary shunt   HFpEF Hypertension Home meds:  Losartan , amlodipine   Stable Permissive hypertension (OK if < 220/120) but gradually normalize in 48-72 hours Long-term BP goal normotensive  Hyperlipidemia Home meds:  Crestor  40mg , resumed in hospital LDL 147, goal < 70 Continue statin at discharge Consider repatha outpatient   Other Stroke Risk Factors Substance abuse - UDS:  THC POSITIVE, Cocaine NONE DETECTED. Patient advised to stop using due to stroke risk. Migraines   Hospital day # 0   Patient seen and examined by NP/APP with MD. MD to update note as needed.   Jorene Last, DNP, FNP-BC [facility] Pager: [phone] I have personally obtained history,examined this patient, reviewed notes, independently viewed imaging studies, participated in medical decision making and plan of care.ROS completed by me personally and pertinent positives fully documented  I have made any additions or clarifications directly to the above note. Agree with note above.  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  He had somewhat similar presentation in October 2020 for did not follow-up and probably was not compliant with his secondary prevention regimen.  Recommend aspirin  and Plavix  for 3 months followed by Plavix  alone and aggressive risk factor modification with strict control of hypertension blood pressure goal below 130/90, lipids with LDL cholesterol goal below 70 mg percent and diabetes with hemoglobin A1c goal below 6.5.  Patient will likely going to need PCSK9 inhibitor injections in addition to Crestor  he is followed at the [facility] clinic which will have to be arranged.  Check CT angiogram of the chest to rule out pulmonary AVM because of the leg bubbles noted on TEE during previous admission.  Patient may also consider possible  participation in CAPTIVA stroke prevention study for intracranial stenosis.  Will given information to review about this and decide.  Return for follow-up with me in the  future in clinic in 2 months.  Long discussion with patient and wife and answered questions.  Discussed with Dr. Jonel. Greater than 50% time during this 50-minute visit was spent in counseling and coordination of care and discussion with patient and care team and answering questions. Eather Popp, MD Medical Director [facility] Pager: [phone] 09/26/2023 4:10 PM

## 2023-09-26 NOTE — Plan of Care (Signed)
   Problem: Education: Goal: Knowledge of disease or condition will improve Outcome: Progressing Goal: Knowledge of secondary prevention will improve (MUST DOCUMENT ALL) Outcome: Progressing Goal: Knowledge of patient specific risk factors will improve (DELETE if not current risk factor) Outcome: Progressing

## 2023-12-08 ENCOUNTER — Encounter (HOSPITAL_COMMUNITY): Payer: Self-pay

## 2023-12-08 ENCOUNTER — Ambulatory Visit (HOSPITAL_COMMUNITY)
Admission: EM | Admit: 2023-12-08 | Discharge: 2023-12-08 | Disposition: A | Discharge status: Home / Self Care | Payer: Self-pay | Attending: Family Medicine | Admitting: Family Medicine

## 2023-12-08 DIAGNOSIS — M25511 Pain in right shoulder: Secondary | ICD-10-CM

## 2023-12-08 DIAGNOSIS — M545 Low back pain, unspecified: Secondary | ICD-10-CM

## 2023-12-08 MED ORDER — PREDNISONE 20 MG PO TABS: 40.0000 mg | ORAL_TABLET | Freq: Every day | ORAL | 0 refills | Status: AC

## 2023-12-08 MED ORDER — TIZANIDINE HCL 4 MG PO TABS: 2.0000 mg | ORAL_TABLET | Freq: Three times a day (TID) | ORAL | 0 refills | Status: AC | PRN
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
## 2024-03-04 ENCOUNTER — Other Ambulatory Visit: Payer: Self-pay

## 2024-03-21 ENCOUNTER — Other Ambulatory Visit (HOSPITAL_COMMUNITY): Payer: Self-pay
